# Patient Record
Sex: Female | Born: 1985 | Race: Black or African American | Hispanic: No | Marital: Married | Smoking: Former smoker
Health system: Southern US, Community
[De-identification: ages and names within clinical notes are randomized; demographics above are authoritative.]

## PROBLEM LIST (undated history)

## (undated) DIAGNOSIS — N898 Other specified noninflammatory disorders of vagina: Secondary | ICD-10-CM

## (undated) DIAGNOSIS — D649 Anemia, unspecified: Secondary | ICD-10-CM

## (undated) DIAGNOSIS — O24419 Gestational diabetes mellitus in pregnancy, unspecified control: Secondary | ICD-10-CM

## (undated) DIAGNOSIS — O26893 Other specified pregnancy related conditions, third trimester: Secondary | ICD-10-CM

## (undated) HISTORY — DX: Gestational diabetes mellitus in pregnancy, unspecified control: O24.419

## (undated) HISTORY — PX: OTHER SURGICAL HISTORY: SHX169

---

## 2014-09-28 LAB — OB RESULTS CONSOLE HEPATITIS B SURFACE ANTIGEN: Hepatitis B Surface Ag: NEGATIVE

## 2014-09-28 LAB — OB RESULTS CONSOLE ABO/RH: RH Type: POSITIVE

## 2014-09-28 LAB — OB RESULTS CONSOLE RUBELLA ANTIBODY, IGM: Rubella: NON-IMMUNE/NOT IMMUNE

## 2014-09-28 LAB — OB RESULTS CONSOLE HIV ANTIBODY (ROUTINE TESTING): HIV: NONREACTIVE

## 2014-09-28 LAB — OB RESULTS CONSOLE ANTIBODY SCREEN: ANTIBODY SCREEN: NEGATIVE

## 2014-10-13 LAB — OB RESULTS CONSOLE GC/CHLAMYDIA
Chlamydia: NEGATIVE
Gonorrhea: NEGATIVE

## 2015-01-17 ENCOUNTER — Inpatient Hospital Stay (HOSPITAL_COMMUNITY): Admit: 2015-01-17 | Payer: Self-pay | Admitting: Obstetrics and Gynecology

## 2015-02-23 LAB — OB RESULTS CONSOLE HGB/HCT, BLOOD
HEMATOCRIT: 35 %
HEMOGLOBIN: 12.3 g/dL

## 2015-02-23 LAB — OB RESULTS CONSOLE PLATELET COUNT: PLATELETS: 224 10*3/uL

## 2015-03-01 ENCOUNTER — Encounter: Payer: 59 | Attending: Obstetrics and Gynecology

## 2015-03-01 VITALS — Ht 62.0 in | Wt 250.5 lb

## 2015-03-01 DIAGNOSIS — Z713 Dietary counseling and surveillance: Secondary | ICD-10-CM | POA: Insufficient documentation

## 2015-03-01 DIAGNOSIS — O24419 Gestational diabetes mellitus in pregnancy, unspecified control: Secondary | ICD-10-CM | POA: Diagnosis present

## 2015-03-01 NOTE — Progress Notes (Signed)
  Patient was seen on 03/01/15 for Gestational Diabetes self-management . The following learning objectives were met by the patient :   States the definition of Gestational Diabetes  States why dietary management is important in controlling blood glucose  Describes the effects of carbohydrates on blood glucose levels  Demonstrates ability to create a balanced meal plan  Demonstrates carbohydrate counting   States when to check blood glucose levels  Demonstrates proper blood glucose monitoring techniques  States the effect of stress and exercise on blood glucose levels  States the importance of limiting caffeine and abstaining from alcohol and smoking  Plan:  Aim for 2 Carb Choices per meal (30 grams) +/- 1 either way for breakfast Aim for 3 Carb Choices per meal (45 grams) +/- 1 either way from lunch and dinner Aim for 1-2 Carbs per snack Begin reading food labels for Total Carbohydrate and sugar grams of foods Consider  increasing your activity level by walking daily as tolerated Begin checking BG before breakfast and 2 hours after first bit of breakfast, lunch and dinner after  as directed by MD  Take medication  as directed by MD  Blood glucose monitor given: One Touch Verio Flex Lot # [id number] X Exp: 05/2016 Blood glucose reading: $RemoveBeforeDE'197mg'CzZjQShwcULjgQk$ /dl 2hpp Biscuit, sweet tea, Reece Cup candy  Patient instructed to monitor glucose levels: FBS: 60 - <90 2 hour: <120  Patient received the following handouts:  Nutrition Diabetes and Pregnancy  Carbohydrate Counting List  Meal Planning worksheet  Patient will be seen for follow-up as needed.

## 2015-03-21 ENCOUNTER — Other Ambulatory Visit (HOSPITAL_COMMUNITY): Payer: Self-pay | Admitting: Obstetrics and Gynecology

## 2015-03-21 DIAGNOSIS — O283 Abnormal ultrasonic finding on antenatal screening of mother: Secondary | ICD-10-CM

## 2015-03-21 DIAGNOSIS — Z3A33 33 weeks gestation of pregnancy: Secondary | ICD-10-CM

## 2015-03-21 DIAGNOSIS — Z3689 Encounter for other specified antenatal screening: Secondary | ICD-10-CM

## 2015-03-21 DIAGNOSIS — O24913 Unspecified diabetes mellitus in pregnancy, third trimester: Secondary | ICD-10-CM

## 2015-03-23 ENCOUNTER — Encounter: Payer: 59 | Attending: Obstetrics and Gynecology | Admitting: *Deleted

## 2015-03-23 ENCOUNTER — Encounter (HOSPITAL_COMMUNITY): Payer: Self-pay

## 2015-03-23 ENCOUNTER — Ambulatory Visit (HOSPITAL_COMMUNITY)
Admission: RE | Admit: 2015-03-23 | Discharge: 2015-03-23 | Disposition: A | Discharge status: Home / Self Care | Payer: 59 | Source: Ambulatory Visit | Attending: Obstetrics and Gynecology | Admitting: Obstetrics and Gynecology

## 2015-03-23 ENCOUNTER — Other Ambulatory Visit (HOSPITAL_COMMUNITY): Payer: Self-pay | Admitting: Obstetrics and Gynecology

## 2015-03-23 DIAGNOSIS — O4403 Placenta previa specified as without hemorrhage, third trimester: Secondary | ICD-10-CM

## 2015-03-23 DIAGNOSIS — O283 Abnormal ultrasonic finding on antenatal screening of mother: Secondary | ICD-10-CM | POA: Diagnosis not present

## 2015-03-23 DIAGNOSIS — O34219 Maternal care for unspecified type scar from previous cesarean delivery: Secondary | ICD-10-CM

## 2015-03-23 DIAGNOSIS — Z3A33 33 weeks gestation of pregnancy: Secondary | ICD-10-CM | POA: Diagnosis not present

## 2015-03-23 DIAGNOSIS — Z713 Dietary counseling and surveillance: Secondary | ICD-10-CM | POA: Diagnosis not present

## 2015-03-23 DIAGNOSIS — O24419 Gestational diabetes mellitus in pregnancy, unspecified control: Secondary | ICD-10-CM | POA: Diagnosis present

## 2015-03-23 DIAGNOSIS — O24414 Gestational diabetes mellitus in pregnancy, insulin controlled: Secondary | ICD-10-CM | POA: Insufficient documentation

## 2015-03-23 DIAGNOSIS — Z3689 Encounter for other specified antenatal screening: Secondary | ICD-10-CM

## 2015-03-23 DIAGNOSIS — O359XX0 Maternal care for (suspected) fetal abnormality and damage, unspecified, not applicable or unspecified: Secondary | ICD-10-CM

## 2015-03-23 DIAGNOSIS — Z36 Encounter for antenatal screening of mother: Secondary | ICD-10-CM | POA: Insufficient documentation

## 2015-03-23 DIAGNOSIS — O24913 Unspecified diabetes mellitus in pregnancy, third trimester: Secondary | ICD-10-CM

## 2015-03-23 HISTORY — DX: Gestational diabetes mellitus in pregnancy, unspecified control: O24.419

## 2015-03-23 NOTE — Progress Notes (Signed)
Patient was seen on 03/23/15 for Gestational Diabetes self-management . Patient was previously seen by myself at [facility] in group class for education Gestational Diabetes. Due to elevated FBS Dr. Cherly Hensenousins placed patient on Glyburide 5mg  with dinner. The patient changed the timing to around lunch because of her 2hpp readings being elevated.  Glucose readings for the past 7 days; FBS range 95- I114, 2hpp; range 93-190mg /dl.   Encouraged Tamara Boyle to return to taking the glyburide with dinner. I phoned Dr. Purnell Shoemakerousin's office and recommended additional glyburide with breakfast, review glucose readings on Monday 03/27/15. At that time if readings are not WNL consider insulin. In review of her dietary intake Tamara RungMonique has much opportunity for improvement. I was very stern about her need to comply with GDM nutrition guidelines.  We reviewed the following objectives :   States why dietary management is important in controlling blood glucose  Describes the effects of carbohydrates on blood glucose levels  Demonstrates ability to create a balanced meal plan  Demonstrates carbohydrate counting   States when to check blood glucose levels  Demonstrates proper blood glucose monitoring techniques  States the effect of stress and exercise on blood glucose levels  States the importance of limiting caffeine and abstaining from alcohol and smoking  Plan:  Aim for 2 Carb Choices per meal (30 grams) +/- 1 either way for breakfast Aim for 3 Carb Choices per meal (45 grams) +/- 1 either way from lunch and dinner Aim for 1-2 Carbs per snack Begin reading food labels for Total Carbohydrate and sugar grams of foods Consider  increasing your activity level by walking daily as tolerated Begin checking BG before breakfast and 2 hours after first bit of breakfast, lunch and dinner after  as directed by MD  Take medication  as directed by MD  Patient instructed to monitor glucose  levels: FBS: 60 - <90 2 hour: <120  Patient received the following handouts:  Nutrition Diabetes and Pregnancy  Carbohydrate Counting List  Meal Planning worksheet  Patient will be seen for follow-up as needed.

## 2015-03-24 ENCOUNTER — Encounter (HOSPITAL_COMMUNITY): Payer: Self-pay | Admitting: Obstetrics and Gynecology

## 2015-03-27 ENCOUNTER — Encounter (HOSPITAL_COMMUNITY): Payer: Self-pay | Admitting: Obstetrics and Gynecology

## 2015-03-27 ENCOUNTER — Inpatient Hospital Stay (HOSPITAL_COMMUNITY)
Admission: EM | Admit: 2015-03-27 | Discharge: 2015-03-28 | Disposition: A | Discharge status: Home / Self Care | Payer: 59 | Source: Ambulatory Visit | Attending: Obstetrics and Gynecology | Admitting: Obstetrics and Gynecology

## 2015-03-27 DIAGNOSIS — N898 Other specified noninflammatory disorders of vagina: Secondary | ICD-10-CM | POA: Diagnosis present

## 2015-03-27 DIAGNOSIS — O4593 Premature separation of placenta, unspecified, third trimester: Secondary | ICD-10-CM | POA: Diagnosis not present

## 2015-03-27 DIAGNOSIS — O4693 Antepartum hemorrhage, unspecified, third trimester: Secondary | ICD-10-CM

## 2015-03-27 DIAGNOSIS — O26893 Other specified pregnancy related conditions, third trimester: Secondary | ICD-10-CM

## 2015-03-27 HISTORY — DX: Other specified pregnancy related conditions, third trimester: O26.893

## 2015-03-27 HISTORY — DX: Anemia, unspecified: D64.9

## 2015-03-27 HISTORY — DX: Other specified noninflammatory disorders of vagina: N89.8

## 2015-03-27 LAB — CBC
HCT: 36.7 % (ref 36.0–46.0)
HEMOGLOBIN: 12.4 g/dL (ref 12.0–15.0)
MCH: 27.1 pg (ref 26.0–34.0)
MCHC: 33.8 g/dL (ref 30.0–36.0)
MCV: 80.3 fL (ref 78.0–100.0)
Platelets: 205 10*3/uL (ref 150–400)
RBC: 4.57 MIL/uL (ref 3.87–5.11)
RDW: 14.8 % (ref 11.5–15.5)
WBC: 5.4 10*3/uL (ref 4.0–10.5)

## 2015-03-28 ENCOUNTER — Encounter (HOSPITAL_COMMUNITY): Payer: Self-pay | Admitting: *Deleted

## 2015-03-28 ENCOUNTER — Inpatient Hospital Stay (HOSPITAL_COMMUNITY): Payer: 59 | Admitting: Anesthesiology

## 2015-03-28 ENCOUNTER — Encounter (HOSPITAL_COMMUNITY)
Admission: AD | Disposition: A | Discharge status: Home / Self Care | Payer: Self-pay | Source: Ambulatory Visit | Attending: Obstetrics and Gynecology

## 2015-03-28 ENCOUNTER — Inpatient Hospital Stay (HOSPITAL_COMMUNITY): Payer: 59

## 2015-03-28 ENCOUNTER — Inpatient Hospital Stay (HOSPITAL_COMMUNITY)
Admission: AD | Admit: 2015-03-28 | Discharge: 2015-03-31 | DRG: 765 | Disposition: A | Discharge status: Home / Self Care | Payer: 59 | Source: Ambulatory Visit | Attending: Obstetrics and Gynecology | Admitting: Obstetrics and Gynecology

## 2015-03-28 DIAGNOSIS — O4593 Premature separation of placenta, unspecified, third trimester: Secondary | ICD-10-CM

## 2015-03-28 DIAGNOSIS — R58 Hemorrhage, not elsewhere classified: Secondary | ICD-10-CM

## 2015-03-28 DIAGNOSIS — O359XX0 Maternal care for (suspected) fetal abnormality and damage, unspecified, not applicable or unspecified: Secondary | ICD-10-CM

## 2015-03-28 DIAGNOSIS — Z823 Family history of stroke: Secondary | ICD-10-CM | POA: Diagnosis not present

## 2015-03-28 DIAGNOSIS — O24419 Gestational diabetes mellitus in pregnancy, unspecified control: Secondary | ICD-10-CM

## 2015-03-28 DIAGNOSIS — D62 Acute posthemorrhagic anemia: Secondary | ICD-10-CM | POA: Diagnosis not present

## 2015-03-28 DIAGNOSIS — Z3A34 34 weeks gestation of pregnancy: Secondary | ICD-10-CM

## 2015-03-28 DIAGNOSIS — Z87891 Personal history of nicotine dependence: Secondary | ICD-10-CM

## 2015-03-28 DIAGNOSIS — O24425 Gestational diabetes mellitus in childbirth, controlled by oral hypoglycemic drugs: Secondary | ICD-10-CM | POA: Diagnosis present

## 2015-03-28 DIAGNOSIS — O9081 Anemia of the puerperium: Secondary | ICD-10-CM | POA: Diagnosis not present

## 2015-03-28 DIAGNOSIS — O365930 Maternal care for other known or suspected poor fetal growth, third trimester, not applicable or unspecified: Secondary | ICD-10-CM | POA: Diagnosis present

## 2015-03-28 DIAGNOSIS — O99214 Obesity complicating childbirth: Secondary | ICD-10-CM | POA: Diagnosis present

## 2015-03-28 DIAGNOSIS — O34219 Maternal care for unspecified type scar from previous cesarean delivery: Secondary | ICD-10-CM | POA: Diagnosis present

## 2015-03-28 DIAGNOSIS — O4103X0 Oligohydramnios, third trimester, not applicable or unspecified: Secondary | ICD-10-CM | POA: Diagnosis present

## 2015-03-28 DIAGNOSIS — O24414 Gestational diabetes mellitus in pregnancy, insulin controlled: Secondary | ICD-10-CM

## 2015-03-28 DIAGNOSIS — N83209 Unspecified ovarian cyst, unspecified side: Secondary | ICD-10-CM | POA: Diagnosis present

## 2015-03-28 DIAGNOSIS — Z833 Family history of diabetes mellitus: Secondary | ICD-10-CM

## 2015-03-28 DIAGNOSIS — O900 Disruption of cesarean delivery wound: Secondary | ICD-10-CM | POA: Diagnosis present

## 2015-03-28 DIAGNOSIS — O4693 Antepartum hemorrhage, unspecified, third trimester: Secondary | ICD-10-CM

## 2015-03-28 DIAGNOSIS — O3483 Maternal care for other abnormalities of pelvic organs, third trimester: Secondary | ICD-10-CM | POA: Diagnosis present

## 2015-03-28 DIAGNOSIS — Z6841 Body Mass Index (BMI) 40.0 and over, adult: Secondary | ICD-10-CM

## 2015-03-28 LAB — GLUCOSE, CAPILLARY: Glucose-Capillary: 79 mg/dL (ref 65–99)

## 2015-03-28 LAB — CBC
HCT: 37.3 % (ref 36.0–46.0)
Hemoglobin: 12.4 g/dL (ref 12.0–15.0)
MCH: 26.7 pg (ref 26.0–34.0)
MCHC: 33.2 g/dL (ref 30.0–36.0)
MCV: 80.4 fL (ref 78.0–100.0)
PLATELETS: 195 10*3/uL (ref 150–400)
RBC: 4.64 MIL/uL (ref 3.87–5.11)
RDW: 14.7 % (ref 11.5–15.5)
WBC: 7.6 10*3/uL (ref 4.0–10.5)

## 2015-03-28 LAB — TYPE AND SCREEN
ABO/RH(D): O POS
ANTIBODY SCREEN: NEGATIVE

## 2015-03-28 LAB — SAVE SMEAR

## 2015-03-28 LAB — GROUP B STREP BY PCR: Group B strep by PCR: NEGATIVE

## 2015-03-28 LAB — FIBRINOGEN: Fibrinogen: 474 mg/dL (ref 204–475)

## 2015-03-28 LAB — PROTIME-INR
INR: 1.05 (ref 0.00–1.49)
Prothrombin Time: 13.9 seconds (ref 11.6–15.2)

## 2015-03-28 LAB — APTT: aPTT: 28 seconds (ref 24–37)

## 2015-03-28 SURGERY — Surgical Case
Anesthesia: Spinal | Site: Abdomen

## 2015-03-28 MED ORDER — PHENYLEPHRINE 8 MG IN D5W 100 ML (0.08MG/ML) PREMIX OPTIME
INJECTION | INTRAVENOUS | Status: DC | PRN
  Administered 2015-03-28: 60 ug/min via INTRAVENOUS

## 2015-03-28 MED ORDER — CEFAZOLIN SODIUM-DEXTROSE 2-3 GM-% IV SOLR
INTRAVENOUS | Status: DC | PRN
  Administered 2015-03-28: 2 g via INTRAVENOUS

## 2015-03-28 MED ORDER — LACTATED RINGERS IV SOLN
INTRAVENOUS | Status: DC
  Administered 2015-03-28 (×2): via INTRAVENOUS

## 2015-03-28 MED ORDER — FENTANYL CITRATE (PF) 100 MCG/2ML IJ SOLN
INTRAMUSCULAR | Status: AC
  Filled 2015-03-28: qty 4

## 2015-03-28 MED ORDER — FENTANYL CITRATE (PF) 100 MCG/2ML IJ SOLN
INTRAMUSCULAR | Status: DC | PRN
  Administered 2015-03-28: 10 ug via INTRATHECAL
  Administered 2015-03-29: 90 ug via INTRAVENOUS

## 2015-03-28 MED ORDER — MORPHINE SULFATE (PF) 0.5 MG/ML IJ SOLN
INTRAMUSCULAR | Status: DC | PRN
  Administered 2015-03-28: .2 mg via INTRATHECAL
  Administered 2015-03-29: 4.8 mg via INTRAVENOUS

## 2015-03-28 MED ORDER — BUPIVACAINE IN DEXTROSE 0.75-8.25 % IT SOLN
INTRATHECAL | Status: DC | PRN
  Administered 2015-03-28: 1.6 mg via INTRATHECAL

## 2015-03-28 MED ORDER — MAGNESIUM SULFATE 50 % IJ SOLN
2.0000 g/h | INTRAMUSCULAR | Status: DC
  Filled 2015-03-28: qty 80

## 2015-03-28 MED ORDER — MEPERIDINE HCL 25 MG/ML IJ SOLN
INTRAMUSCULAR | Status: AC
  Filled 2015-03-28: qty 1

## 2015-03-28 MED ORDER — MORPHINE SULFATE (PF) 0.5 MG/ML IJ SOLN
INTRAMUSCULAR | Status: AC
  Filled 2015-03-28: qty 100

## 2015-03-28 MED ORDER — GLYBURIDE 5 MG PO TABS
5.0000 mg | ORAL_TABLET | Freq: Two times a day (BID) | ORAL | Status: DC
  Filled 2015-03-28 (×2): qty 1

## 2015-03-28 MED ORDER — MAGNESIUM SULFATE BOLUS VIA INFUSION
4.0000 g | Freq: Once | INTRAVENOUS | Status: AC
  Administered 2015-03-28: 4 g via INTRAVENOUS
  Filled 2015-03-28: qty 500

## 2015-03-28 MED ORDER — CITRIC ACID-SODIUM CITRATE 334-500 MG/5ML PO SOLN
ORAL | Status: AC
  Administered 2015-03-28: 30 mL
  Filled 2015-03-28: qty 15

## 2015-03-28 MED ORDER — BETAMETHASONE SOD PHOS & ACET 6 (3-3) MG/ML IJ SUSP
12.0000 mg | INTRAMUSCULAR | Status: DC
  Administered 2015-03-28: 12 mg via INTRAMUSCULAR
  Filled 2015-03-28: qty 2

## 2015-03-28 MED ORDER — BUPIVACAINE HCL (PF) 0.25 % IJ SOLN
INTRAMUSCULAR | Status: AC
  Filled 2015-03-28: qty 30

## 2015-03-28 SURGICAL SUPPLY — 41 items
BARRIER ADHS 3X4 INTERCEED (GAUZE/BANDAGES/DRESSINGS) ×3 IMPLANT
BENZOIN TINCTURE PRP APPL 2/3 (GAUZE/BANDAGES/DRESSINGS) IMPLANT
CLAMP CORD UMBIL (MISCELLANEOUS) IMPLANT
CLOSURE WOUND 1/2 X4 (GAUZE/BANDAGES/DRESSINGS)
CLOTH BEACON ORANGE TIMEOUT ST (SAFETY) ×3 IMPLANT
CONTAINER PREFILL 10% NBF 15ML (MISCELLANEOUS) IMPLANT
DRAPE C SECTION CLR SCREEN (DRAPES) ×3 IMPLANT
DRAPE SHEET LG 3/4 BI-LAMINATE (DRAPES) IMPLANT
DRSG OPSITE POSTOP 4X10 (GAUZE/BANDAGES/DRESSINGS) ×3 IMPLANT
DURAPREP 26ML APPLICATOR (WOUND CARE) ×3 IMPLANT
ELECT REM PT RETURN 9FT ADLT (ELECTROSURGICAL) ×3
ELECTRODE REM PT RTRN 9FT ADLT (ELECTROSURGICAL) ×1 IMPLANT
EXTRACTOR VACUUM M CUP 4 TUBE (SUCTIONS) ×2 IMPLANT
EXTRACTOR VACUUM M CUP 4' TUBE (SUCTIONS) ×1
GLOVE BIOGEL PI IND STRL 7.0 (GLOVE) ×1 IMPLANT
GLOVE BIOGEL PI INDICATOR 7.0 (GLOVE) ×2
GLOVE ECLIPSE 6.5 STRL STRAW (GLOVE) ×3 IMPLANT
GOWN STRL REUS W/TWL LRG LVL3 (GOWN DISPOSABLE) ×6 IMPLANT
KIT ABG SYR 3ML LUER SLIP (SYRINGE) ×3 IMPLANT
NEEDLE HYPO 22GX1.5 SAFETY (NEEDLE) ×3 IMPLANT
NEEDLE HYPO 25X5/8 SAFETYGLIDE (NEEDLE) ×3 IMPLANT
NS IRRIG 1000ML POUR BTL (IV SOLUTION) ×6 IMPLANT
PACK C SECTION WH (CUSTOM PROCEDURE TRAY) ×3 IMPLANT
PAD OB MATERNITY 4.3X12.25 (PERSONAL CARE ITEMS) ×3 IMPLANT
RTRCTR C-SECT PINK 25CM LRG (MISCELLANEOUS) IMPLANT
SPONGE LAP 18X18 X RAY DECT (DISPOSABLE) ×6 IMPLANT
STRIP CLOSURE SKIN 1/2X4 (GAUZE/BANDAGES/DRESSINGS) IMPLANT
SUT CHROMIC GUT AB #0 18 (SUTURE) IMPLANT
SUT MNCRL 0 VIOLET CTX 36 (SUTURE) ×3 IMPLANT
SUT MON AB 4-0 PS1 27 (SUTURE) ×3 IMPLANT
SUT MONOCRYL 0 CTX 36 (SUTURE) ×6
SUT PLAIN 2 0 (SUTURE)
SUT PLAIN 2 0 XLH (SUTURE) ×3 IMPLANT
SUT PLAIN ABS 2-0 CT1 27XMFL (SUTURE) IMPLANT
SUT VIC AB 0 CT1 36 (SUTURE) ×6 IMPLANT
SUT VIC AB 2-0 CT1 27 (SUTURE) ×2
SUT VIC AB 2-0 CT1 TAPERPNT 27 (SUTURE) ×1 IMPLANT
SUT VIC AB 4-0 PS2 27 (SUTURE) IMPLANT
SYR CONTROL 10ML LL (SYRINGE) ×3 IMPLANT
TOWEL OR 17X24 6PK STRL BLUE (TOWEL DISPOSABLE) ×3 IMPLANT
TRAY FOLEY CATH SILVER 14FR (SET/KITS/TRAYS/PACK) IMPLANT

## 2015-03-28 NOTE — MAU Note (Addendum)
Was here last night for bleeding.  Saw midwife.  Started bleeding again today.  occ sharp pains, a lot of pressure.  Baby is very active. Sent from office

## 2015-03-28 NOTE — MAU Provider Note (Signed)
History     CSN: [id number]  Arrival date and time: 03/27/15 2258  Provider on unit: 2050 Provider at bedside: 2350   Chief Complaint  Patient presents with  . Vaginal Bleeding   HPI  Tamara Boyle is a 29 yo G2P1001 female at 34.[redacted] wks gestation by LMP, presenting with complaints of gushing blood, mild abdominal cramping and low back pain.  She reports that the blood trickled down her legs at home. (+) FM. She is class A2 GDM on Glyburide 5 mg q pm.  Placenta previa hx in this current pregnancy, but informed it was resolved at her MFM sono last week. She resumed SI twice since notification on resolved previa; no postcoital bleeding noted.  She did have some mucoid vaginal d/c last night and was told to monitor at home by on-call CNM. In addition to GDM, her prenatal care has been complicated by intrauterine growth restriction. Her primary OB provider is Dr. Cherly Hensenousins.  Past Medical History  Diagnosis Date  . Gestational diabetes mellitus, antepartum   . Gestational diabetes   . Vaginal discharge during pregnancy in third trimester 03/27/2015  . Anemia     Past Surgical History  Procedure Laterality Date  . None    . Cesarean section      Family History  Problem Relation Age of Onset  . Diabetes Other   . Stroke Other   . Obesity Other   . Heart disease Other     Social History  Substance Use Topics  . Smoking status: Former Smoker    Quit date: 08/25/2014  . Smokeless tobacco: None  . Alcohol Use: No    Allergies: No Known Allergies  Prescriptions prior to admission  Medication Sig Dispense Refill Last Dose  . Iron TABS Take 1 each by mouth daily.   Past Week at Unknown time  . Prenatal Multivit-Min-Fe-FA (PRENATAL VITAMINS PO) Take 1 each by mouth daily.   03/27/2015 at Unknown time    Review of Systems  Constitutional: Negative.   HENT: Negative.   Eyes: Negative.   Respiratory: Negative.   Cardiovascular: Negative.   Gastrointestinal: Positive for  abdominal pain and constipation.       Mild cramping; mild constipation, but able to have BM since being in MAU  Genitourinary:       Some lower pelvic pressure and cramping  Musculoskeletal: Negative.   Skin: Negative.   Neurological: Negative.   Endo/Heme/Allergies: Negative.   Psychiatric/Behavioral: Negative.     Results for orders placed or performed during the hospital encounter of 03/27/15 (from the past 24 hour(s))  CBC     Status: None   Collection Time: 03/27/15 11:25 PM  Result Value Ref Range   WBC 5.4 4.0 - 10.5 K/uL   RBC 4.57 3.87 - 5.11 MIL/uL   Hemoglobin 12.4 12.0 - 15.0 g/dL   HCT 40.936.7 81.136.0 - 91.446.0 %   MCV 80.3 78.0 - 100.0 fL   MCH 27.1 26.0 - 34.0 pg   MCHC 33.8 30.0 - 36.0 g/dL   RDW 78.214.8 95.611.5 - 21.315.5 %   Platelets 205 150 - 400 K/uL   CEFM  FHR: 140 bpm / moderate variability / accels present / decels absent TOCO: none   Physical Exam   Blood pressure 133/87, pulse 92, temperature 98.6 F (37 C), temperature source Oral, resp. rate 18, height 5\' 2"  (1.575 m), weight 112.946 kg (249 lb), last menstrual period 07/31/2014, SpO2 99 %.  Physical Exam  Constitutional: She  is oriented to person, place, and time. She appears well-developed and well-nourished.  HENT:  Head: Normocephalic.  Eyes: Pupils are equal, round, and reactive to light.  Neck: Normal range of motion.  Cardiovascular: Normal rate, regular rhythm and normal heart sounds.   Respiratory: Effort normal and breath sounds normal.  GI: Soft. Bowel sounds are normal.  Genitourinary:  Small amount of dark red blood in vaginal vault cleared with 3 large cotton swabs; scant amount of mucous coming from cx os; SVE: closed/thick/high/soft  Musculoskeletal: Normal range of motion.  Neurological: She is alert and oriented to person, place, and time. She has normal reflexes.  Psychiatric: She has a normal mood and affect. Her behavior is normal. Judgment and thought content normal.    MAU Course   Procedures NST OB Limited U/S - check placenta CBC - WNL Assessment and Plan  1) G2P1001, 34.2 wks 2) Vaginal Bleeding affecting pregnancy, third trimester 3) Class A2 GDM - on Glyburide  1) Discharge Home 2) PTL Precautions 3) Keep scheduled appt 10/28 with Dr. Cherly Hensen 4) Call the office, if bleeding worsens or not improved 5) Pelvic Rest   Kenard Gower MSN, CNM 03/28/2015, 12:05 AM

## 2015-03-28 NOTE — H&P (Addendum)
OB ADMISSION/ HISTORY & PHYSICAL:  Admission Date: 03/28/2015  2:22 PM  Admit Diagnosis: 34.[redacted] weeks gestation, Placental abruption  Tamara Boyle is a 29 y.o. female presenting for VB. Seen in MAU last night for VB with negative exam and sono. Evaluation today reveals findings consistent with abruption.  Prenatal History: G2P1001   EDC:05/07/2015, by Last Menstrual Period   [facility]  Primary Ob Provider: Dr. Cherly Hensenousins Prenatal course complicated by A2GDM on Glyburide-poor compliance and control, small BPD and low AF-evaluated by MFM, ovarian cyst, previous CS, hx PCOS, obesity, and persistent placenta previa although was found to be resolved on sono 5 days ago.   Prenatal Labs: ABO, Rh:   O Pos Antibody:  neg Rubella:   NI RPR:   neg HBsAg:  neg  HIV:   neg GBS:   unknown 1 hr GTT: 186 Genetic screen: neg  Medical / Surgical History :  Past medical history:  Past Medical History  Diagnosis Date  . Gestational diabetes mellitus, antepartum   . Gestational diabetes   . Vaginal discharge during pregnancy in third trimester 03/27/2015  . Anemia      Past surgical history:  Past Surgical History  Procedure Laterality Date  . None    . Cesarean section      Family History:  Family History  Problem Relation Age of Onset  . Diabetes Other   . Stroke Other   . Obesity Other   . Heart disease Other      Social History:  reports that she quit smoking about 7 months ago. She does not have any smokeless tobacco history on file. She reports that she does not drink alcohol or use illicit drugs.  Allergies: Review of patient's allergies indicates no known allergies.   Current Medications at time of admission:  Prior to Admission medications   Medication Sig Start Date End Date Taking? Authorizing Provider  acetaminophen (TYLENOL) 500 MG tablet Take 1,000 mg by mouth every 6 (six) hours as needed for moderate pain.   Yes Historical  Provider, MD  Bisacodyl (DULCOLAX PO) Take 1 tablet by mouth daily as needed (constipation).   Yes Historical Provider, MD  CALCIUM PO Take 1 tablet by mouth daily.   Yes Historical Provider, MD  glyBURIDE (DIABETA) 5 MG tablet Take 5 mg by mouth 2 (two) times daily with a meal.   Yes Historical Provider, MD  Iron TABS Take 1 each by mouth daily.   Yes Historical Provider, MD  Prenatal Multivit-Min-Fe-FA (PRENATAL VITAMINS PO) Take 1 each by mouth daily.   Yes Historical Provider, MD     Review of Systems: +VB +FM No LOF No back pain  Physical Exam:  VS: Blood pressure 119/80, pulse 79, temperature 98.1 F (36.7 C), temperature source Oral, resp. rate 18, weight 112.311 kg (247 lb 9.6 oz), last menstrual period 07/31/2014.  General: alert and oriented, appears comfortable Heart: RRR Lungs: Clear lung fields Abdomen: Gravid, soft and non-tender, non-distended / uterus: gravid, non-tender Extremities: No edema Genitalia / VE: Dilation: 1.5 Effacement (%): 70 Station: -2 Exam by:: Melanie Bhambri CNM FHR: baseline rate 130 / variability mod / accelerations + / no decelerations TOCO: 2-4, mild  Assessment: 34.[redacted] weeks gestation Placental abruption A2GDM Cat I FHT  Plan:  Admit to antepartum, MgSO4 for PTL until betamethasone complete BR , continuous EFM,  MFM consult, NICU consult, .diabetes consult to assist with anticipated elevation of BS Consult with Dr. Cherly Hensenousins / agrees with  plan of care / MD to manage   Donette Larry, N MSN, CNM 03/28/2015, 5:17 PM   ON call MD/Primary provider:    Unexplained placental abruption with presentation of 3rd trim vaginal bleeding. Previous previa now resolved Class A2 GDM not controlled P) add antiphosholipid antibody panel. Noncompliance with BS. Will remain inpt.  CBG 4x/d Pt known to MFM.

## 2015-03-28 NOTE — Plan of Care (Signed)
Problem: Consults Goal: Birthing Suites Patient Information Press F2 to bring up selections list   Pt < [redacted] weeks EGA     

## 2015-03-28 NOTE — Anesthesia Procedure Notes (Signed)
Spinal Patient location during procedure: OR Staffing Anesthesiologist: Dejean Tribby Performed by: anesthesiologist  Preanesthetic Checklist Completed: patient identified, site marked, surgical consent, pre-op evaluation, timeout performed, IV checked, risks and benefits discussed and monitors and equipment checked Spinal Block Patient position: sitting Prep: DuraPrep Patient monitoring: continuous pulse ox, blood pressure and heart rate Approach: midline Location: L3-4 Injection technique: single-shot Needle Needle type: Sprotte  Needle gauge: 24 G Needle length: 9 cm Additional Notes Functioning IV was confirmed and monitors were applied. Sterile prep and drape, including hand hygiene, mask and sterile gloves were used. The patient was positioned and the spine was prepped. The skin was anesthetized with lidocaine.  Free flow of clear CSF was obtained prior to injecting local anesthetic into the CSF.  The spinal needle aspirated freely following injection.  The needle was carefully withdrawn.  The patient tolerated the procedure well. Consent was obtained prior to procedure with all questions answered and concerns addressed. Risks including but not limited to bleeding, infection, nerve damage, paralysis, failed block, inadequate analgesia, allergic reaction, high spinal, itching and headache were discussed and the patient wished to proceed.   Joni Norrod, MD     

## 2015-03-28 NOTE — Progress Notes (Signed)
Pitcher of water to pt

## 2015-03-28 NOTE — Progress Notes (Signed)
Fabian NovemberM Bhambri CNM notified of pt's admission and status. Aware of uterine active, reactive FHR, scant vag bleeding on admission. Occ pain LLQ but comfortable and unaware of ctxs. WIll see pt

## 2015-03-28 NOTE — Consult Note (Signed)
[facility]  Consultation Service: Neonatology   Dr. Garwin Brothers has asked for consultation on Tamara. Boyle MRN #[id number] regarding the care of a premature infant at 39 2/[redacted] week EGA. Thank you for inviting [place] to see this patient.   Reason for consult:  Explain the possible complications, the prognosis, and the care of a premature infant at 58 2/[redacted] week EGA.  Chief complaint: Tamara. Edmonds is a 29yo female with a 34 2/7 week IUP with an estimated weight of ~2100 grams.   My key findings of this patient's HPI are:  I have reviewed the patient's chart and have met with her. The salient information is as follows:  29yo female with a 34 2/[redacted] week EGA little girl who has represented with complaints of vaginal bleeding.  H/o previa reportedly resolved last week.  +contractions.  No LOF/ROM.  No infection concerns.    Prenatal Labs: ABO, Rh:   O Pos Antibody:  neg Rubella:   NI RPR:   neg HBsAg:  neg  HIV:   neg GBS:   unknown 1 hr GTT: 186 Genetic screen: neg  Prenatal care: good Pregnancy complications: A1OIN on Glyburide-poor compliance and control, small BPD and low AF-evaluated by MFM, ovarian cyst, previous CS, hx PCOS, obesity, and persistent placenta previa although was found to be resolved on sono 5 days ago. Maternal antibiotics: This patient's mother is not on file. Maternal Steroids: partial   Most recent dose:  03/28/15 at [place]    My recommendations for this patient and my actions included:   1. In the presence of the patient and her husband, I spent 25 minutes discussing the possible complications and outcomes of prematurity at this gestational age. I gave the patient a [employer] of [employer] handout, written in lay language, that discussed the common complications and survival data of the premature infant and a summary handout with graph and table. I discussed the potential need for resuscitation at birth, mechanical ventilation and surfactant administration for  respiratory distress, IV fluids pending establishment of enteral feeds (encouraged breast milk feeding to which she plans to do), antibiotics for possible sepsis, temperature support, and monitoring. I also discussed the potential risk of complications such as intracranial hemorrhage, retinopathy, hearing deficit, and chronic lung disease. I also discussed the potential length of stay in the neonatal intensive care unit for about 6 weeks. I discussed this with parents in detail and they expressed an understanding of the risks and complications of prematurity.   2. I also discussed the expected survival of an infant born at 63 2/7 weeks, which is >97%. We further discussed that roughly <3% of neonates born at this age have severe neurological complications and that around 25% have school difficulties. She expressed an understanding of this information.   3. I informed her that the NICU team would be present at the delivery. She agreed that all appropriate medical measures could be taken to resuscitate her infant at the delivery.   4. A visit to the NICU by the infant's mother and/or a significant other was encouraged. Visitation policy was discussed particularly for their elementary age son.   Final Impression:  Tamara Daley is a 29yo female with a 40 2/7 week IUP who is threatening to deliver and who now understands the possible complications and prognosis of her infant.     ______________________________________________________________________  Thank you for asking [place] to participate in the care of this patient. Please do not hesitate to contact us again  if you are aware of any further ways we can be of assistance.   Sincerely,  Monia Sabal. Katherina Mires, MD Neonatologist  I spent ~40 minutes in consultation time, of which 25 minutes was spent in direct face to face counseling.

## 2015-03-28 NOTE — Anesthesia Preprocedure Evaluation (Signed)
Anesthesia Evaluation  Patient identified by MRN, date of birth, ID band Patient awake    Reviewed: Allergy & Precautions, NPO status , Patient's Chart, lab work & pertinent test results  History of Anesthesia Complications Negative for: history of anesthetic complications  Airway Mallampati: II  TM Distance: >3 FB Neck ROM: Full    Dental no notable dental hx. (+) Dental Advisory Given   Pulmonary former smoker,    Pulmonary exam normal breath sounds clear to auscultation       Cardiovascular negative cardio ROS Normal cardiovascular exam Rhythm:Regular Rate:Normal     Neuro/Psych negative neurological ROS  negative psych ROS   GI/Hepatic negative GI ROS, Neg liver ROS,   Endo/Other  diabetesMorbid obesity  Renal/GU negative Renal ROS  negative genitourinary   Musculoskeletal negative musculoskeletal ROS (+)   Abdominal   Peds negative pediatric ROS (+)  Hematology negative hematology ROS (+)   Anesthesia Other Findings   Reproductive/Obstetrics (+) Pregnancy                             Anesthesia Physical Anesthesia Plan  ASA: III and emergent  Anesthesia Plan: Spinal   Post-op Pain Management:    Induction:   Airway Management Planned:   Additional Equipment:   Intra-op Plan:   Post-operative Plan:   Informed Consent: I have reviewed the patients History and Physical, chart, labs and discussed the procedure including the risks, benefits and alternatives for the proposed anesthesia with the patient or authorized representative who has indicated his/her understanding and acceptance.   Dental advisory given  Plan Discussed with:   Anesthesia Plan Comments:         Anesthesia Quick Evaluation

## 2015-03-28 NOTE — Discharge Instructions (Signed)
Pelvic Rest °Pelvic rest is sometimes recommended for women when:  °· The placenta is partially or completely covering the opening of the cervix (placenta previa). °· There is bleeding between the uterine wall and the amniotic sac in the first trimester (subchorionic hemorrhage). °· The cervix begins to open without labor starting (incompetent cervix, cervical insufficiency). °· The labor is too early (preterm labor). °HOME CARE INSTRUCTIONS °· Do not have sexual intercourse, stimulation, or an orgasm. °· Do not use tampons, douche, or put anything in the vagina. °· Do not lift anything over 10 pounds (4.5 kg). °· Avoid strenuous activity or straining your pelvic muscles. °SEEK MEDICAL CARE IF:  °· You have any vaginal bleeding during pregnancy. Treat this as a potential emergency. °· You have cramping pain felt low in the stomach (stronger than menstrual cramps). °· You notice vaginal discharge (watery, mucus, or bloody). °· You have a low, dull backache. °· There are regular contractions or uterine tightening. °SEEK IMMEDIATE MEDICAL CARE IF: °You have vaginal bleeding and have placenta previa.  °  °This information is not intended to replace advice given to you by your health care provider. Make sure you discuss any questions you have with your health care provider. °  °Document Released: 09/14/2010 Document Revised: 08/12/2011 Document Reviewed: 11/21/2014 °Elsevier Interactive Patient Education ©2016 Elsevier Inc[employer] ° °Vaginal Bleeding During Pregnancy, Third Trimester °A small amount of bleeding (spotting) from the vagina is relatively common in pregnancy. Various things can cause bleeding or spotting in pregnancy. Sometimes the bleeding is normal and is not a problem. However, bleeding during the third trimester can also be a sign of something serious for the mother and the baby. Be sure to tell your health care provider about any vaginal bleeding right away.  °Some possible causes of vaginal bleeding during  the third trimester include:  °· The placenta may be partially or completely covering the opening to the cervix (placenta previa).   °· The placenta may have separated from the uterus (abruption of the placenta).   °· There may be an infection or growth on the cervix.   °· You may be starting labor, called discharging of the mucus plug.   °· The placenta may grow into the muscle layer of the uterus (placenta accreta).   °HOME CARE INSTRUCTIONS  °Watch your condition for any changes. The following actions may help to lessen any discomfort you are feeling:  °· Follow your health care provider's instructions for limiting your activity. If your health care provider orders bed rest, you may need to stay in bed and only get up to use the bathroom. However, your health care provider may allow you to continue light activity. °· If needed, make plans for someone to help with your regular activities and responsibilities while you are on bed rest. °· Keep track of the number of pads you use each day, how often you change pads, and how soaked (saturated) they are. Write this down. °· Do not use tampons. Do not douche. °· Do not have sexual intercourse or orgasms until approved by your health care provider. °· Follow your health care provider's advice about lifting, driving, and physical activities. °· If you pass any tissue from your vagina, save the tissue so you can show it to your health care provider.   °· Only take over-the-counter or prescription medicines as directed by your health care provider. °· Do not take aspirin because it can make you bleed.   °· Keep all follow-up appointments as directed by your health care provider. °  SEEK MEDICAL CARE IF: °· You have any vaginal bleeding during any part of your pregnancy. °· You have cramps or labor pains. °· You have a fever, not controlled by medicine. °SEEK IMMEDIATE MEDICAL CARE IF:  °· You have severe cramps or pain in your back or belly (abdomen). °· You have  chills. °· You have a gush of fluid from the vagina. °· You pass large clots or tissue from your vagina. °· Your bleeding increases. °· You feel light-headed or weak. °· You pass out. °· You feel less movement or no movement of the baby.   °MAKE SURE YOU: °· Understand these instructions. °· Will watch your condition. °· Will get help right away if you are not doing well or get worse. °  °This information is not intended to replace advice given to you by your health care provider. Make sure you discuss any questions you have with your health care provider. °  °Document Released: 08/10/2002 Document Revised: 05/25/2013 Document Reviewed: 01/25/2013 °Elsevier Interactive Patient Education ©2016 Elsevier Inc. ° °

## 2015-03-28 NOTE — Progress Notes (Signed)
Pt is unaware of ctxs.

## 2015-03-28 NOTE — Progress Notes (Signed)
Called for increased vaginal bleeding Pt notes painful ctx   O:  Filed Vitals:   03/28/15 1951 03/28/15 2001 03/28/15 2011 03/28/15 2102  BP: 125/86 112/84 125/69 124/68  Pulse: 86 90 89 88  Temp:      TempSrc:      Resp:  20  20  Height:      Weight:      SpO2:      abd gravid soft nontender Pelvic: SSE; (+) small amount of blood in vault 1/70%/-1 vtx  Tracing: baseline 140 (+) accels. Ctx q 3-9 mins BS 79 CBC Latest Ref Rng 03/28/2015 03/27/2015 02/23/2015  WBC 4.0 - 10.5 K/uL 7.6 5.4 -  Hemoglobin 12.0 - 15.0 g/dL 78.212.4 95.612.4 21.312.3  Hematocrit 36.0 - 46.0 % 37.3 36.7 35  Platelets 150 - 400 K/uL 195 205 224    IMP: placental abruption on magnesium sulfate S/P BMZ x 1 Previous C/S PTL  Class A2 GDM IUP @ 34 2/7 weeks  P) recheck placenta, BPP, AFI Stadol prn after sonographic assessment. Obtained surgical consent with risk of surgery reviewed including  Infection, bleeding, injury to surrounding organ structures, internal scar tissue, possible blood transfusion and its risk(HIV, acute rxn,  Hepatitis) . Signed and on chart. Disc plan to try to get the 2nd BMZ in place provided no evidence of fetal or maternal compromise

## 2015-03-28 NOTE — MAU Provider Note (Signed)
History     CSN: [id number]  Arrival date and time: 03/28/15 1422  Provider on unit: 2050 Provider at bedside: 2350   Chief Complaint  Patient presents with  . Vaginal Bleeding   Vaginal Bleeding Associated symptoms include abdominal pain and constipation.    [name] is a 29 yo G2P1001 female at 34.[redacted] wks gestation by LMP, presenting with complaints of gushing blood, mild abdominal cramping and low back pain.  She reports that the blood trickled down her legs at home. (+) FM. She is class A2 GDM on Glyburide 5 mg q pm.  Placenta previa hx in this current pregnancy, but informed it was resolved at her MFM sono last week. She resumed SI twice since notification on resolved previa; no postcoital bleeding noted.  She did have some mucoid vaginal d/c last night and was told to monitor at home by on-call CNM. In addition to GDM, her prenatal care has been complicated by intrauterine growth restriction. Her primary OB provider is Dr. Cherly Hensenousins.  Past Medical History  Diagnosis Date  . Gestational diabetes mellitus, antepartum   . Gestational diabetes   . Vaginal discharge during pregnancy in third trimester 03/27/2015  . Anemia     Past Surgical History  Procedure Laterality Date  . None    . Cesarean section      Family History  Problem Relation Age of Onset  . Diabetes Other   . Stroke Other   . Obesity Other   . Heart disease Other     Social History  Substance Use Topics  . Smoking status: Former Smoker    Quit date: 08/25/2014  . Smokeless tobacco: None  . Alcohol Use: No    Allergies: No Known Allergies  Prescriptions prior to admission  Medication Sig Dispense Refill Last Dose  . Iron TABS Take 1 each by mouth daily.   Past Week at Unknown time  . Prenatal Multivit-Min-Fe-FA (PRENATAL VITAMINS PO) Take 1 each by mouth daily.   03/27/2015 at Unknown time    Review of Systems  Constitutional: Negative.   HENT: Negative.   Eyes: Negative.    Respiratory: Negative.   Cardiovascular: Negative.   Gastrointestinal: Positive for abdominal pain and constipation.       Mild cramping; mild constipation, but able to have BM since being in MAU  Genitourinary: Positive for vaginal bleeding.       Some lower pelvic pressure and cramping  Musculoskeletal: Negative.   Skin: Negative.   Neurological: Negative.   Endo/Heme/Allergies: Negative.   Psychiatric/Behavioral: Negative.     Results for orders placed or performed during the hospital encounter of 03/27/15 (from the past 24 hour(s))  CBC     Status: None   Collection Time: 03/27/15 11:25 PM  Result Value Ref Range   WBC 5.4 4.0 - 10.5 K/uL   RBC 4.57 3.87 - 5.11 MIL/uL   Hemoglobin 12.4 12.0 - 15.0 g/dL   HCT 40.936.7 81.136.0 - 91.446.0 %   MCV 80.3 78.0 - 100.0 fL   MCH 27.1 26.0 - 34.0 pg   MCHC 33.8 30.0 - 36.0 g/dL   RDW 78.214.8 95.611.5 - 21.315.5 %   Platelets 205 150 - 400 K/uL   CEFM  FHR: 140 bpm / moderate variability / accels present / decels absent TOCO: None  OB Limited U/S No previa or abruption seen / Anterior Placenta above the os / AFI WNL / cephalic   Physical Exam   Blood pressure 119/80, pulse 79,  temperature 98.1 F (36.7 C), temperature source Oral, resp. rate 18, weight 112.311 kg (247 lb 9.6 oz), last menstrual period 07/31/2014.  Physical Exam  Constitutional: She is oriented to person, place, and time. She appears well-developed and well-nourished.  HENT:  Head: Normocephalic.  Eyes: Pupils are equal, round, and reactive to light.  Neck: Normal range of motion.  Cardiovascular: Normal rate, regular rhythm and normal heart sounds.   Respiratory: Effort normal and breath sounds normal.  GI: Soft. Bowel sounds are normal.  Genitourinary:  Small amount of dark red blood in vaginal vault cleared with 3 large cotton swabs; scant amount of mucous coming from cx os; SVE: closed/thick/high/soft  Musculoskeletal: Normal range of motion.  Neurological: She is alert and  oriented to person, place, and time. She has normal reflexes.  Psychiatric: She has a normal mood and affect. Her behavior is normal. Judgment and thought content normal.    MAU Course  Procedures NST OB Limited U/S - check placenta CBC - WNL Assessment and Plan  1) G2P1001, 34.2 wks 2) Vaginal Bleeding affecting pregnancy, third trimester 3) Class A2 GDM - on Glyburide  1) Discharge Home 2) PTL Precautions / Bleeding Precautions reviewed 3) Keep scheduled appt 10/28 with Dr. Cherly Hensen 4) Call the office, if bleeding worsens or not improved 5) Pelvic Rest until further notice  *Dr. Cherly Hensen notified of assessment and plan - agrees  Kenard Gower MSN, CNM 03/28/2015, 12:05 AM

## 2015-03-28 NOTE — MAU Provider Note (Signed)
History     CSN: [id number]  Arrival date and time: 03/28/15 1422   None     Chief Complaint  Patient presents with  . Vaginal Bleeding   HPI Comments: G2P1001 @34 .2 wks here for evaluation of bleeding. Was seen in MAU last night for brb-IC 2 days prior, evaluated with neg sono and sent home. Reports bright red VB started back this am around 0800 and has been intermittent throughout the day. Denies ctx but reports baby has been moving all day and has been very uncomfortable. No LOF. Pregnancy complicted by A2GDM on Glyburide-poor compliance and control, small BPD and low AF-evaluated by MFM, ovarian cyst, previous CS, hx PCOS, obesity, and persistent placenta previa although was found to be resolved on sono 5 days ago.   OB History    Gravida Para Term Preterm AB TAB SAB Ectopic Multiple Living   2 1 1       1       Past Medical History  Diagnosis Date  . Gestational diabetes mellitus, antepartum   . Gestational diabetes   . Vaginal discharge during pregnancy in third trimester 03/27/2015  . Anemia     Past Surgical History  Procedure Laterality Date  . None    . Cesarean section      Family History  Problem Relation Age of Onset  . Diabetes Other   . Stroke Other   . Obesity Other   . Heart disease Other     Social History  Substance Use Topics  . Smoking status: Former Smoker    Quit date: 08/25/2014  . Smokeless tobacco: None  . Alcohol Use: No    Allergies: No Known Allergies  Prescriptions prior to admission  Medication Sig Dispense Refill Last Dose  . acetaminophen (TYLENOL) 500 MG tablet Take 1,000 mg by mouth every 6 (six) hours as needed for moderate pain.   03/27/2015 at Unknown time  . Bisacodyl (DULCOLAX PO) Take 1 tablet by mouth daily as needed (constipation).   03/27/2015 at Unknown time  . CALCIUM PO Take 1 tablet by mouth daily.   Past Week at Unknown time  . glyBURIDE (DIABETA) 5 MG tablet Take 5 mg by mouth 2 (two) times daily with a  meal.   03/28/2015 at Unknown time  . Iron TABS Take 1 each by mouth daily.   Past Week at Unknown time  . Prenatal Multivit-Min-Fe-FA (PRENATAL VITAMINS PO) Take 1 each by mouth daily.   03/27/2015 at Unknown time    Review of Systems  Constitutional: Negative.   HENT: Negative.   Eyes: Negative.   Respiratory: Negative.   Cardiovascular: Negative.   Gastrointestinal: Negative.   Genitourinary: Negative.   Musculoskeletal: Negative.   Skin: Negative.   Neurological: Negative.   Endo/Heme/Allergies: Negative.   Psychiatric/Behavioral: Negative.    Physical Exam   Blood pressure 119/80, pulse 79, temperature 98.1 F (36.7 C), temperature source Oral, resp. rate 18, weight 112.311 kg (247 lb 9.6 oz), last menstrual period 07/31/2014.  Physical Exam  Constitutional: She is oriented to person, place, and time. She appears well-developed and well-nourished.  HENT:  Head: Normocephalic and atraumatic.  Neck: Normal range of motion. Neck supple.  Cardiovascular: Normal rate and regular rhythm.   Respiratory: Effort normal and breath sounds normal.  GI: Soft.  gravid  Genitourinary:  Speculum: visually closed, brb from os-small amt-no active bleeding; moderate amt in culdesac SVE: 1.5/70/-2, vtx  Musculoskeletal: Normal range of motion.  Neurological: She is alert and  oriented to person, place, and time.  Skin: Skin is warm and dry.  Psychiatric: She has a normal mood and affect.  EFM: 130 bpm, mod variability, +accels, no decels Toco: q2-3, mild  MAU Course  Procedures  Sono: vtx, normal AFI, clot noted to inferior and superior edge of placenta   Assessment and Plan  34.[redacted] weeks gestation Placental abruption A2GDM Cat I FHT  Admit to inpatient, continuous EFM, MgSO4 for neuro protection, NPO, MFM consult, NICU consult  Dr. Cherly Hensen notified of A/P, MD to manage  [facility], Melena Hayes, N 03/28/2015, 4:59 PM cou

## 2015-03-29 ENCOUNTER — Encounter (HOSPITAL_COMMUNITY): Payer: Self-pay | Admitting: *Deleted

## 2015-03-29 DIAGNOSIS — O24419 Gestational diabetes mellitus in pregnancy, unspecified control: Secondary | ICD-10-CM

## 2015-03-29 LAB — RPR: RPR Ser Ql: NONREACTIVE

## 2015-03-29 LAB — ABO/RH: ABO/RH(D): O POS

## 2015-03-29 MED ORDER — ZOLPIDEM TARTRATE 5 MG PO TABS: 5.0000 mg | ORAL_TABLET | Freq: Every evening | ORAL | Status: DC | PRN

## 2015-03-29 MED ORDER — SODIUM CHLORIDE 0.9 % IJ SOLN: 3.0000 mL | INTRAMUSCULAR | Status: DC | PRN

## 2015-03-29 MED ORDER — OXYCODONE-ACETAMINOPHEN 5-325 MG PO TABS
2.0000 | ORAL_TABLET | ORAL | Status: DC | PRN
  Administered 2015-03-30 – 2015-03-31 (×4): 2 via ORAL
  Filled 2015-03-29 (×4): qty 2

## 2015-03-29 MED ORDER — ONDANSETRON HCL 4 MG/2ML IJ SOLN
INTRAMUSCULAR | Status: AC
  Filled 2015-03-29: qty 2

## 2015-03-29 MED ORDER — SIMETHICONE 80 MG PO CHEW: 80.0000 mg | CHEWABLE_TABLET | ORAL | Status: DC | PRN

## 2015-03-29 MED ORDER — BUPIVACAINE HCL (PF) 0.25 % IJ SOLN
INTRAMUSCULAR | Status: DC | PRN
  Administered 2015-03-28: 10 mL

## 2015-03-29 MED ORDER — SCOPOLAMINE 1 MG/3DAYS TD PT72
MEDICATED_PATCH | TRANSDERMAL | Status: DC | PRN
  Administered 2015-03-28: 1 via TRANSDERMAL

## 2015-03-29 MED ORDER — ONDANSETRON HCL 4 MG/2ML IJ SOLN: 4.0000 mg | Freq: Once | INTRAMUSCULAR | Status: DC | PRN

## 2015-03-29 MED ORDER — PHENYLEPHRINE 8 MG IN D5W 100 ML (0.08MG/ML) PREMIX OPTIME
INJECTION | INTRAVENOUS | Status: AC
  Filled 2015-03-29: qty 100

## 2015-03-29 MED ORDER — DIPHENHYDRAMINE HCL 25 MG PO CAPS: 25.0000 mg | ORAL_CAPSULE | ORAL | Status: DC | PRN

## 2015-03-29 MED ORDER — NALOXONE HCL 0.4 MG/ML IJ SOLN
0.4000 mg | INTRAMUSCULAR | Status: DC | PRN
Start: 2015-03-29 — End: 2015-03-30

## 2015-03-29 MED ORDER — LACTATED RINGERS IV SOLN
INTRAVENOUS | Status: DC | PRN
  Administered 2015-03-28: via INTRAVENOUS

## 2015-03-29 MED ORDER — DIBUCAINE 1 % RE OINT: 1.0000 "application " | TOPICAL_OINTMENT | RECTAL | Status: DC | PRN

## 2015-03-29 MED ORDER — SCOPOLAMINE 1 MG/3DAYS TD PT72
MEDICATED_PATCH | TRANSDERMAL | Status: AC
  Filled 2015-03-29: qty 1

## 2015-03-29 MED ORDER — 0.9 % SODIUM CHLORIDE (POUR BTL) OPTIME
TOPICAL | Status: DC | PRN
  Administered 2015-03-29: 1000 mL

## 2015-03-29 MED ORDER — WITCH HAZEL-GLYCERIN EX PADS: 1.0000 "application " | MEDICATED_PAD | CUTANEOUS | Status: DC | PRN

## 2015-03-29 MED ORDER — LACTATED RINGERS IV SOLN: INTRAVENOUS | Status: DC

## 2015-03-29 MED ORDER — IBUPROFEN 600 MG PO TABS
600.0000 mg | ORAL_TABLET | Freq: Four times a day (QID) | ORAL | Status: DC
  Administered 2015-03-29 – 2015-03-31 (×10): 600 mg via ORAL
  Filled 2015-03-29 (×10): qty 1

## 2015-03-29 MED ORDER — SODIUM CHLORIDE 0.9 % IJ SOLN: 3.0000 mL | Freq: Two times a day (BID) | INTRAMUSCULAR | Status: DC

## 2015-03-29 MED ORDER — DIPHENHYDRAMINE HCL 25 MG PO CAPS: 25.0000 mg | ORAL_CAPSULE | Freq: Four times a day (QID) | ORAL | Status: DC | PRN

## 2015-03-29 MED ORDER — PRENATAL MULTIVITAMIN CH
1.0000 | ORAL_TABLET | Freq: Every day | ORAL | Status: DC
  Administered 2015-03-29 – 2015-03-31 (×3): 1 via ORAL
  Filled 2015-03-29 (×3): qty 1

## 2015-03-29 MED ORDER — METHYLERGONOVINE MALEATE 0.2 MG PO TABS: 0.2000 mg | ORAL_TABLET | ORAL | Status: DC | PRN

## 2015-03-29 MED ORDER — DEXAMETHASONE SODIUM PHOSPHATE 10 MG/ML IJ SOLN
INTRAMUSCULAR | Status: DC | PRN
  Administered 2015-03-28: 10 mg via INTRAVENOUS

## 2015-03-29 MED ORDER — DIPHENHYDRAMINE HCL 50 MG/ML IJ SOLN: 12.5000 mg | INTRAMUSCULAR | Status: DC | PRN

## 2015-03-29 MED ORDER — FERROUS SULFATE 325 (65 FE) MG PO TABS
325.0000 mg | ORAL_TABLET | Freq: Two times a day (BID) | ORAL | Status: DC
  Administered 2015-03-29 – 2015-03-31 (×4): 325 mg via ORAL
  Filled 2015-03-29 (×4): qty 1

## 2015-03-29 MED ORDER — NALBUPHINE HCL 10 MG/ML IJ SOLN: 5.0000 mg | Freq: Once | INTRAMUSCULAR | Status: AC | PRN

## 2015-03-29 MED ORDER — SENNOSIDES-DOCUSATE SODIUM 8.6-50 MG PO TABS
2.0000 | ORAL_TABLET | ORAL | Status: DC
Start: 2015-03-30 — End: 2015-03-31
  Administered 2015-03-29 – 2015-03-30 (×2): 2 via ORAL
  Filled 2015-03-29 (×2): qty 2

## 2015-03-29 MED ORDER — SODIUM CHLORIDE 0.9 % IV SOLN: 250.0000 mL | INTRAVENOUS | Status: DC

## 2015-03-29 MED ORDER — CEFAZOLIN SODIUM-DEXTROSE 2-3 GM-% IV SOLR
INTRAVENOUS | Status: AC
  Filled 2015-03-29: qty 50

## 2015-03-29 MED ORDER — FENTANYL CITRATE (PF) 100 MCG/2ML IJ SOLN: 25.0000 ug | INTRAMUSCULAR | Status: DC | PRN

## 2015-03-29 MED ORDER — OXYCODONE-ACETAMINOPHEN 5-325 MG PO TABS
1.0000 | ORAL_TABLET | ORAL | Status: DC | PRN
  Administered 2015-03-30 (×4): 1 via ORAL
  Filled 2015-03-29 (×4): qty 1

## 2015-03-29 MED ORDER — SIMETHICONE 80 MG PO CHEW
80.0000 mg | CHEWABLE_TABLET | ORAL | Status: DC
  Administered 2015-03-29 – 2015-03-30 (×2): 80 mg via ORAL
  Filled 2015-03-29 (×2): qty 1

## 2015-03-29 MED ORDER — SIMETHICONE 80 MG PO CHEW
80.0000 mg | CHEWABLE_TABLET | Freq: Three times a day (TID) | ORAL | Status: DC
Start: 2015-03-29 — End: 2015-03-31
  Administered 2015-03-30 – 2015-03-31 (×4): 80 mg via ORAL
  Filled 2015-03-29 (×4): qty 1

## 2015-03-29 MED ORDER — NALBUPHINE HCL 10 MG/ML IJ SOLN
5.0000 mg | INTRAMUSCULAR | Status: DC | PRN
  Administered 2015-03-29: 5 mg via SUBCUTANEOUS

## 2015-03-29 MED ORDER — ONDANSETRON HCL 4 MG/2ML IJ SOLN
INTRAMUSCULAR | Status: DC | PRN
  Administered 2015-03-29: 4 mg via INTRAVENOUS

## 2015-03-29 MED ORDER — OXYTOCIN 40 UNITS IN LACTATED RINGERS INFUSION - SIMPLE MED
62.5000 mL/h | INTRAVENOUS | Status: AC
Start: 2015-03-29 — End: 2015-03-29

## 2015-03-29 MED ORDER — LANOLIN HYDROUS EX OINT: 1.0000 "application " | TOPICAL_OINTMENT | CUTANEOUS | Status: DC | PRN

## 2015-03-29 MED ORDER — NALBUPHINE HCL 10 MG/ML IJ SOLN
5.0000 mg | Freq: Once | INTRAMUSCULAR | Status: AC | PRN
  Administered 2015-03-29: 5 mg via SUBCUTANEOUS

## 2015-03-29 MED ORDER — METHYLERGONOVINE MALEATE 0.2 MG/ML IJ SOLN: 0.2000 mg | INTRAMUSCULAR | Status: DC | PRN

## 2015-03-29 MED ORDER — NALOXONE HCL 2 MG/2ML IJ SOSY
1.0000 ug/kg/h | PREFILLED_SYRINGE | INTRAVENOUS | Status: DC | PRN
  Filled 2015-03-29: qty 2

## 2015-03-29 MED ORDER — NALBUPHINE HCL 10 MG/ML IJ SOLN
5.0000 mg | INTRAMUSCULAR | Status: DC | PRN
  Filled 2015-03-29: qty 1

## 2015-03-29 MED ORDER — BISACODYL 10 MG RE SUPP: 10.0000 mg | Freq: Every day | RECTAL | Status: DC | PRN

## 2015-03-29 MED ORDER — MEPERIDINE HCL 25 MG/ML IJ SOLN
INTRAMUSCULAR | Status: DC | PRN
  Administered 2015-03-28 – 2015-03-29 (×2): 12.5 mg via INTRAVENOUS

## 2015-03-29 MED ORDER — FLEET ENEMA 7-19 GM/118ML RE ENEM: 1.0000 | ENEMA | Freq: Every day | RECTAL | Status: DC | PRN

## 2015-03-29 MED ORDER — OXYTOCIN 10 UNIT/ML IJ SOLN
INTRAMUSCULAR | Status: AC
  Filled 2015-03-29: qty 4

## 2015-03-29 MED ORDER — ONDANSETRON HCL 4 MG/2ML IJ SOLN: 4.0000 mg | Freq: Three times a day (TID) | INTRAMUSCULAR | Status: DC | PRN

## 2015-03-29 MED ORDER — ACETAMINOPHEN 325 MG PO TABS: 650.0000 mg | ORAL_TABLET | ORAL | Status: DC | PRN

## 2015-03-29 MED ORDER — SCOPOLAMINE 1 MG/3DAYS TD PT72
1.0000 | MEDICATED_PATCH | Freq: Once | TRANSDERMAL | Status: DC
  Filled 2015-03-29: qty 1

## 2015-03-29 MED ORDER — LACTATED RINGERS IV SOLN
40.0000 [IU] | INTRAVENOUS | Status: DC | PRN
  Administered 2015-03-28: 40 [IU] via INTRAVENOUS

## 2015-03-29 MED ORDER — DEXAMETHASONE SODIUM PHOSPHATE 10 MG/ML IJ SOLN
INTRAMUSCULAR | Status: AC
  Filled 2015-03-29: qty 1

## 2015-03-29 MED ORDER — MENTHOL 3 MG MT LOZG: 1.0000 | LOZENGE | OROMUCOSAL | Status: DC | PRN

## 2015-03-29 MED ORDER — MEPERIDINE HCL 25 MG/ML IJ SOLN: 6.2500 mg | INTRAMUSCULAR | Status: DC | PRN

## 2015-03-29 NOTE — Progress Notes (Signed)
Patient ID: Tamara PiccoloMonique Boyle, female   DOB: 04/01/1986, 29 y.o.   MRN: [id number] Patient in NICU with baby at the time of rounds. Will return later today for rounds.  Kenard GowerAWSON, Dmarcus Decicco, M, MSN, CNM 03/29/2015, 8:36 AM

## 2015-03-29 NOTE — Transfer of Care (Signed)
Immediate Anesthesia Transfer of Care Note  Patient: Tamara PiccoloMonique Burak  Procedure(s) Performed: Procedure(s): CESAREAN SECTION (N/A)  Patient Location: PACU  Anesthesia Type:Spinal  Level of Consciousness: awake, alert , oriented and patient cooperative  Airway & Oxygen Therapy: Patient Spontanous Breathing  Post-op Assessment: Report given to RN and Post -op Vital signs reviewed and stable  Post vital signs: Reviewed and stable  Last Vitals:  Filed Vitals:   03/28/15 2301  BP: 133/70  Pulse: 89  Temp:   Resp:     Complications: No apparent anesthesia complications

## 2015-03-29 NOTE — Anesthesia Postprocedure Evaluation (Signed)
  Anesthesia Post-op Note  Patient: Tamara PiccoloMonique Boyle  Procedure(s) Performed: Procedure(s): CESAREAN SECTION (N/A)  Patient Location: PACU and Women's Unit  Anesthesia Type:Spinal  Level of Consciousness: awake, alert  and oriented  Airway and Oxygen Therapy: Patient Spontanous Breathing and Patient connected to nasal cannula oxygen  Post-op Pain: none  Post-op Assessment: Post-op Vital signs reviewed, Patient's Cardiovascular Status Stable, Respiratory Function Stable, Patent Airway, No signs of Nausea or vomiting, Adequate PO intake, Pain level controlled, No headache, No backache and Patient able to bend at knees      Post-op Vital Signs: Reviewed and stable  Last Vitals:  Filed Vitals:   03/29/15 0720  BP: 121/63  Pulse: 91  Temp: 36.8 C  Resp: 22    Complications: No apparent anesthesia complications

## 2015-03-29 NOTE — Progress Notes (Signed)
sono results noted: BPP 4/8 Oligohydramnios Continued vaginal bleeding  given above findings suspect that recent gush of blood was probably SROM admixed with underlying placental abruption. Pt advised of need for delivery now. Given prematurity, expect NICU admit. BMZ x 1 given. Disc issue of permanent sterilization. Pt opted not to proceed with this given prematurity. OR notified

## 2015-03-29 NOTE — Anesthesia Postprocedure Evaluation (Signed)
  Anesthesia Post-op Note  Patient: Tamara PiccoloMonique Boyle  Procedure(s) Performed: Procedure(s) (LRB): CESAREAN SECTION (N/A)  Patient Location: PACU  Anesthesia Type: Spinal  Level of Consciousness: awake and alert   Airway and Oxygen Therapy: Patient Spontanous Breathing  Post-op Pain: mild  Post-op Assessment: Post-op Vital signs reviewed, Patient's Cardiovascular Status Stable, Respiratory Function Stable, Patent Airway and No signs of Nausea or vomiting  Last Vitals:  Filed Vitals:   03/29/15 0130  BP: 130/77  Pulse: 98  Temp:   Resp: 19    Post-op Vital Signs: stable   Complications: No apparent anesthesia complications

## 2015-03-29 NOTE — Progress Notes (Signed)
Inpatient Diabetes Program Recommendations  AACE/ADA: New Consensus Statement on Inpatient Glycemic Control (2015)  Target Ranges:  Prepandial:   less than 140 mg/dL      Peak postprandial:   less than 180 mg/dL (1-2 hours)      Critically ill patients:  140 - 180 mg/dL   Review of Glycemic Control Referral received for [redacted] weeks gestation delivered by c/sction yesterday, 03/27/16. Patient had been on glyburide during pregnancy.  Unless patient has hx of dm 2 prior to pregnancy, doubt that patient will need any glucose control medications. However, will follow and recommend as needed should cbg's elevate. Will follow and glad to assist with further concerns for this patient. Patient will need to follow up with a primary MD for testing for glucose control at first post-partum check-up at 6 weeks.   Thank you Lenor CoffinAnn Wymon Swaney, RN, MSN, CDE  Diabetes Inpatient Program Office: [phone] Pager: [phone] 8:00 am to 5:00 pm

## 2015-03-29 NOTE — Progress Notes (Signed)
Patient ID: Tamara PiccoloMonique Boyle, female   DOB: 11/11/1985, 29 y.o.   MRN: [id number] Subjective: S/P Repeat Cesarean Delivery d/t Placenta Abruption POD# 1 Information for the patient's newborn:  Al DecantLindsay, Girl Delia [[id number]][[id number]]  female  Reports feeling well.  Just a little sore. Feeding: bottle - pumping breast milk Patient reports tolerating PO.  Breast symptoms: none Pain controlled with ibuprofen (OTC) and narcotic analgesics including Percocet Denies HA/SOB/C/P/N/V/dizziness. Flatus present. No BM. She reports vaginal bleeding as normal, without clots.  She is ambulating, Foley discontinued 2 hours ago. Not urinating yet, but hopeful.     Objective:   VS:  Filed Vitals:   03/29/15 0830 03/29/15 1057 03/29/15 1245 03/29/15 1456  BP: 120/72 124/75 112/69 126/58  Pulse: 101 90 74 84  Temp: 98.6 F (37 C) 98.3 F (36.8 C) 97.9 F (36.6 C) 98.6 F (37 C)  TempSrc: Oral Oral Oral Oral  Resp: 20 18 21 20   Height:      Weight:      SpO2:  98% 98% 100%     Intake/Output Summary (Last 24 hours) at 03/29/15 1652 Last data filed at 03/29/15 1329  Gross per 24 hour  Intake 2304.97 ml  Output   2425 ml  Net -120.03 ml        Recent Labs  03/27/15 2325 03/28/15 1918  WBC 5.4 7.6  HGB 12.4 12.4  HCT 36.7 37.3  PLT 205 195     Blood type: O POS (10/25 1918)  Rubella: Nonimmune (04/27 0000)     Physical Exam:   General: alert, cooperative and no distress  CV: Regular rate and rhythm, S1S2 present or without murmur or extra heart sounds  Resp: clear  Abdomen: soft, nontender, normal bowel sounds  Incision: clean, dry, intact and Skin well-approximated staples  Uterine Fundus: firm, 1 FB below umbilicus, nontender  Lochia: minimal  Ext: extremities normal, atraumatic, no cyanosis or edema, Homans sign is negative, no sign of DVT and no edema, redness or tenderness in the calves or thighs   Assessment/Plan: 29 y.o.   POD# 1.  S/P Cesarean Delivery.  Indications: Repeat  Cesarean Delivery and Placenta Abruption                Principal Problem:   Postpartum care following cesarean delivery (10/25) Active Problems:   Placental abruption in third trimester   GDM, class A2  Doing well, stable.               Regular diet as tolerated Ambulate 2-3 times/day Routine post-op care  Kenard GowerAWSON, Sesar Madewell, M, MSN, CNM 03/29/2015, 4:52 PM

## 2015-03-29 NOTE — Brief Op Note (Signed)
03/28/2015 - 03/29/2015  12:52 AM  PATIENT:  Tamara Boyle  29 y.o. female  PRE-OPERATIVE DIAGNOSIS:  Placental Abruption, Previous Cesarean Section, BPP 4/10, oligohydramnios, Class A2 GDM, IUP @ 34 2/7 weeks  POST-OPERATIVE DIAGNOSIS:  Placental Abruption,  Cesarean scar Dehiscence, oligohydramnios, IUP @ 34 2/7 weeks, Class A2 GDM, BPP 4/10  PROCEDURE:  Urgent Repeat Cesarean section, Sharl MaKerr hysterotomy  SURGEON:  Surgeon(s) and Role:    * Maxie BetterSheronette Brittay Mogle, MD - Primary  PHYSICIAN ASSISTANT:   ASSISTANTS: Donette LarryMelanie Bhambri, CNM   ANESTHESIA:   spinal Findings: live female with scant amniotic fluid, anterior placenta with abruption, 1.5cm wound dehiscence on right, nl tubes,(left paratubal cyst), bilateral polycystic ovaries, ratty placenta with fundal necrotic membranous tissue Cord ph done: 7.20  EBL:  Total I/O In: 1600 [I.V.:1600] Out: 1300 [Urine:600; Blood:700]  BLOOD ADMINISTERED:none  DRAINS: none   LOCAL MEDICATIONS USED:  MARCAINE     SPECIMEN:  Source of Specimen:  placenta  DISPOSITION OF SPECIMEN:  PATHOLOGY  COUNTS:  YES  TOURNIQUET:  * No tourniquets in log *  DICTATION: .Other Dictation: Dictation Number [id number]  PLAN OF CARE: Admit to inpatient   PATIENT DISPOSITION:  PACU - hemodynamically stable.   Delay start of Pharmacological VTE agent (>24hrs) due to surgical blood loss or risk of bleeding: no

## 2015-03-29 NOTE — Addendum Note (Signed)
Addendum  created 03/29/15 0724 by Shanon PayorSuzanne M Kimball Manske, CRNA   Modules edited: Notes Section   Notes Section:  File: [id number]

## 2015-03-30 ENCOUNTER — Encounter (HOSPITAL_COMMUNITY): Payer: Self-pay | Admitting: Obstetrics and Gynecology

## 2015-03-30 LAB — CBC
HEMATOCRIT: 30.2 % — AB (ref 36.0–46.0)
HEMOGLOBIN: 10.1 g/dL — AB (ref 12.0–15.0)
MCH: 27.1 pg (ref 26.0–34.0)
MCHC: 33.4 g/dL (ref 30.0–36.0)
MCV: 81 fL (ref 78.0–100.0)
Platelets: 207 10*3/uL (ref 150–400)
RBC: 3.73 MIL/uL — ABNORMAL LOW (ref 3.87–5.11)
RDW: 14.8 % (ref 11.5–15.5)
WBC: 11 10*3/uL — ABNORMAL HIGH (ref 4.0–10.5)

## 2015-03-30 MED ORDER — MAGNESIUM OXIDE 400 (241.3 MG) MG PO TABS
200.0000 mg | ORAL_TABLET | Freq: Every day | ORAL | Status: DC
Start: 2015-03-30 — End: 2015-03-31
  Administered 2015-03-30 – 2015-03-31 (×2): 200 mg via ORAL
  Filled 2015-03-30 (×2): qty 0.5

## 2015-03-30 MED ORDER — MEASLES, MUMPS & RUBELLA VAC ~~LOC~~ INJ
0.5000 mL | INJECTION | Freq: Once | SUBCUTANEOUS | Status: AC
  Administered 2015-03-31: 0.5 mL via SUBCUTANEOUS
  Filled 2015-03-30: qty 0.5

## 2015-03-30 NOTE — Op Note (Signed)
Tamara Boyle, Tamara Boyle             ACCOUNT NO.:  [id number]  MEDICAL RECORD NO.:  [id number]  LOCATION:  9302                          FACILITY:  [facility]  PHYSICIAN:  Maxie Better, M.D.DATE OF BIRTH:  1986/02/23  DATE OF PROCEDURE:  03/28/2015 DATE OF DISCHARGE:                              OPERATIVE REPORT   PREOPERATIVE DIAGNOSES: 1. Placental abruption. 2. Oligohydramnios. 3. Previous cesarean section. 4. Biophysical profile 4/10. 5. Class A2 gestational diabetes. 6. Intrauterine gestation at 34-2/7th weeks.  PROCEDURES: 1. Repeat cesarean section, Kerr hysterotomy.  POSTOPERATIVE DIAGNOSES: 1. Placental abruption. 2. Cesarean scar dehiscence. 3. Oligohydramnios. 4. Intrauterine gestation at 34-2/7th weeks. 5. Class A2 gestational diabetes.  ANESTHESIA:  Spinal.  SURGEON:  Maxie Better, M.D.  ASSISTANT:  Donette Larry, CNM  DESCRIPTION OF PROCEDURE:  Under adequate spinal anesthesia, the patient was placed in supine position with a left lateral tilt.  She was sterilely prepped and draped in usual fashion.  Indwelling Foley catheter was sterilely placed.  A 0.25% Marcaine was injected along the previous Pfannenstiel skin incision.  Pfannenstiel skin incision was then made, carried down to the rectus fascia.  The rectus fascia was opened transversely.  Rectus fascia was then bluntly and sharply dissected off the rectus muscle in the superior and inferior fashion. Omental adhesions were noted in the anterior peritoneum.  Therefore, the omental adhesion was displaced to the patient's left and subsequently, the parietal peritoneum was identified and opened and extended.  On opening the abdominal cavity, dark blood was noted.  An Alexis self- retaining retractor was then placed.  Adhesions of the bladder to the serosa of the uterus was noted superiorly which were then lysed.  On looking at the lower uterine segment, there was a 1.5 cm window in the uterine  scar with evidence of the blood clot was noted.  A finger was placed through that site and uterine incision was extended bluntly using my fingers.  At that point, clotted blood was noted as well as the placenta which was anterior was partially extruding.  Through all that, I went and extracted live female, still intact in the amniotic sac, minimal fluid was noted.  The amniotic sac was removed and subsequently, baby was fully delivered.  Cord was clamped, cut, and the baby was transferred to the awaiting pediatricians who assigned Apgars of 8 and 9 at 1 and 5 minutes.  The placenta easily was removed and sent to Pathology.  The uterine incision had no extension.  The upper portion of the uterine cavity was notable for necrotic-appearing membranous tissue which required ring forceps removal, swiping with sponge, lap sponges were  not successful in removing.  The ring clamp was then used to remove the necrotic material and curettaged then to gently curette the upper fundal region until all membranous material was removed.  At that point, the uterine incision was closed in 2 layers, the first layer with 0 Monocryl running locked stitch, second layer was imbricated using 0 Monocryl suture.  The bladder was displaced further inferiorly.  Normal tubes were noted bilaterally.  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 Cord pH and cord blood had been obtained.  Abdomen was irrigated and suctioned.  Interceed was  placed in inverted T fashion in the lower uterine segment.  The parietal peritoneum was then closed with 2-0 Vicryl.  The rectus fascia was closed with 0 Vicryl x2.  The subcutaneous area was irrigated, small bleeders cauterized.  Interrupted 2-0 plain sutures placed and the skin approximated with Ethicon staples.  SPECIMEN:  Placenta, sent to Pathology.  ESTIMATED BLOOD LOSS:  700 mL.  URINE OUTPUT:  600 mL.  INTRAOPERATIVE FLUID:  2100 mL.  COUNTS:  Sponge and instrument counts x2  was correct.  COMPLICATIONS:  None.  The baby was transferred to the neonatal intensive care due to prematurity.     Maxie BetterSheronette Raevin Wierenga, M.D.     [place]/MEDQ  D:  03/29/2015  T:  03/30/2015  Job:  [id number]

## 2015-03-30 NOTE — Progress Notes (Addendum)
POD # 2  Subjective: Pt reports feeling well/ Pain controlled with Motrin and Percocet Tolerating po/Voiding without problems/ No n/v/ Flatus present Activity: ad lib Bleeding is light Newborn info:  Information for the patient's newborn:  Al DecantLindsay, Girl Shann [[id number]][[id number]]  female   Feeding: breastpumping  Objective: VS:  Filed Vitals:   03/29/15 1456 03/29/15 1734 03/29/15 2146 03/30/15 0522  BP: 126/58 109/66 108/78 109/63  Pulse: 84 79 80 77  Temp: 98.6 F (37 C) 98.4 F (36.9 C) 98.2 F (36.8 C) 98.4 F (36.9 C)  TempSrc: Oral Axillary Oral Oral  Resp: 20 18 18 18   Height:      Weight:      SpO2: 100% 100% 98% 99%    I&O: Intake/Output      10/26 0701 - 10/27 0700 10/27 0701 - 10/28 0700   P.O.  120   I.V. (mL/kg)     Total Intake(mL/kg)  120 (1.1)   Urine (mL/kg/hr) 1100 (0.4)    Blood     Total Output 1100     Net -1100 +120          LABS:  Recent Labs  03/28/15 1918 03/30/15 0525  WBC 7.6 11.0*  HGB 12.4 10.1*  HCT 37.3 30.2*  PLT 195 207    Blood type: --/--/O POS, O POS (10/25 1918) Rubella: Nonimmune (04/27 0000)    Physical Exam:  General: alert, cooperative and no distress CV: Regular rate and rhythm Resp: CTA bilaterally Abdomen: soft, nontender, normal bowel sounds Uterine Fundus: firm, below umbilicus, nontender Incision: Covered with Tegaderm and honeycomb dressing; no significant drainage, edema, bruising, or erythema; well approximated with staples Lochia: minimal Ext: extremities normal, atraumatic, no cyanosis or edema and Homans sign is negative, no sign of DVT   Assessment/: POD # 2/ G2P1101/ S/P C/Section d/t placental abruption, BPP 4/10  A2GDM, delivered ABL anemia Rubella non-immune Doing well  Plan: FBS in am Continue routine post op orders Anticipate discharge home tomorrow   Signed: Donette LarryBHAMBRI, Pasha Gadison, Dorris CarnesN, MSN, CNM 03/30/2015, 11:23 AM

## 2015-03-30 NOTE — Progress Notes (Signed)
Pt walking with spouse to NICU to visit baby.

## 2015-03-30 NOTE — Progress Notes (Signed)
CSW acknowledges NICU admission.    Patient screened out for psychosocial assessment since none of the following apply:  Psychosocial stressors documented in mother or baby's chart  Gestation less than 32 weeks  Code at delivery   Critically ill infant  Infant with anomalies  Please contact the Clinical Social Worker if specific needs arise, or by MOB's request.       

## 2015-03-30 NOTE — Lactation Note (Signed)
This note was copied from the chart of Tamara Valora PiccoloMonique Maul. Lactation Consultation Note  Patient Name: Tamara Boyle ZOXWR'UToday's Date: 03/30/2015 Reason for consult: Initial assessment;NICU baby  NICU baby 6233 hours old, 6264w4d CGA. Mom states that she briefly attempted to nurse her first child, and she never really saw her milk increase very much. Mom reports that she has probably pumped 4 or 5 times total in 33 hours. Enc mom to use DEBP 8 times/24 hours for 15 minutes, followed by hand expression. Demonstrated hand expression with no colostrum present. Mom has large, pendulous breast with flat nipples. Assisted mom to use cloth diapers rolled up and placed under her breast for support. Mom states that she had a hard time keeping flanges against her breasts while pumping yesterday, but reports increased comfort today with additional support. Mom states that she did see colostrum from right breast yesterday while pumping. Assisted mom to use DEBP and pump flanges seem to be a good fit. Mom pumped for 15 minutes and able to get a slight bit of colostrum on flanges, but not enough to collect. Enc mom to rub into nipples.   Mom give colostrum containers with instructions. Mom also given NICU booklet and LC brochure with review. Mom aware of OP/BFSG, community resources, and [facility] after [facility]. Mom has DEBP and enc to call insurance company as well. Enc mom to offer STS and nuzzling/latching at breast as she and baby able. Mom aware of pumping rooms in NICU.  Maternal Data Has patient been taught Hand Expression?: Yes Does the patient have breastfeeding experience prior to this delivery?: Yes  Feeding Feeding Type: Formula Length of feed: 10 min  LATCH Score/Interventions                      Lactation Tools Discussed/Used Pump Review: Setup, frequency, and cleaning;Milk Storage Initiated by:: Bedside RN. Date initiated:: 03/28/15   Consult Status Consult  Status: Follow-up Date: 03/31/15 Follow-up type: In-patient    [name], [name] 03/30/2015, 9:31 AM

## 2015-03-30 NOTE — Progress Notes (Signed)
Discussed importance of regular pumping for milk production; discussed supply and demand cycle

## 2015-03-31 MED ORDER — OXYCODONE-ACETAMINOPHEN 5-325 MG PO TABS: 1.0000 | ORAL_TABLET | ORAL | Status: DC | PRN

## 2015-03-31 MED ORDER — MAGNESIUM OXIDE 400 (241.3 MG) MG PO TABS: 200.0000 mg | ORAL_TABLET | Freq: Every day | ORAL | Status: DC

## 2015-03-31 MED ORDER — IBUPROFEN 800 MG PO TABS: 800.0000 mg | ORAL_TABLET | Freq: Three times a day (TID) | ORAL | Status: DC

## 2015-03-31 NOTE — Discharge Summary (Signed)
OB Discharge Summary  Patient Name: Tamara Boyle DOB: [birth year] MRN: [id number]  Date of admission: 03/28/2015  Admitting diagnosis: Placenta abruption, Class A2 GDM, previous C/S, IUP @ 34 2/7 weeks  Intrauterine pregnancy: [redacted]w[redacted]d    Secondary diagnosis:class A2 GDM with poor compliance / previous cesarean section  Date of discharge: 03/31/2015     Discharge diagnosis: Placental abruption, PPROM(oligohydramnios), Class A2 GDM, Previous C/S, PTB @ 3422/7 weeks, cesarean scar wound dehiscence   Prenatal history: G2P1101   EDC : 05/07/2015, by Last Menstrual Period  [facility]  Primary provider : Cherrelle Plante Prenatal course complicated by class A2 GDM poor compliance / previous cesarean section / hx previa - resolved/previous cesarean  Prenatal Labs: ABO, Rh: --/--/O POS, O POS (10/25 1918)  Antibody: NEG (10/25 1918) Rubella: Nonimmune (04/27 0000)  / MMR booster given prior to DC RPR: Non Reactive (10/25 1918)  HBsAg: Negative (04/27 0000)  HIV: Non-reactive (04/27 0000)                       Hospital course:  admit with recurrent vaginal bleeding in third trimester. Sonogram showed placental abruption, nl fluid and cervix notably  1 cm dilated . - BMZ initial dose given and magnesium sulfate for preterm labor and allowance for steroid.  Continued vaginal bleeding and progression into presumed  SROM as  repeat sono showed BPP 4/8, oligohydramnios / hx previous CS with planned BTL but deferred sterilization at time of repeat cesarean section due to premature birth - repeat cesarean section for non reassuring fetal testing and oligohydramnios. Uncomplicated postoperative course  Delivering PROVIDER: Jazline Cumbee                                                            Complications: cesarean scar wound dehiscence  Newborn Data: Live born female  Birth Weight: 5 lb 1.5 oz (2310 g) APGAR: 8, 9  Baby Feeding: Bottle and  Breast Disposition:NICU  Post partum procedures:MMR vaccine booster  Postpartum contraception: Not Discussed    Labs: Lab Results  Component Value Date   WBC 11.0* 03/30/2015   HGB 10.1* 03/30/2015   HCT 30.2* 03/30/2015   MCV 81.0 03/30/2015   PLT 207 03/30/2015   No flowsheet data found.  Physical Exam @ time of discharge:  Filed Vitals:   03/30/15 1200 03/30/15 1700 03/30/15 2324 03/31/15 0541  BP: 94/47 109/60 125/71 102/55  Pulse: 97 96 84 87  Temp: 98.3 F (36.8 C) 98.4 F (36.9 C) 98.1 F (36.7 C) 98.1 F (36.7 C)  TempSrc: Oral Oral Oral Oral  Resp: 20 18 18 16   Height:      Weight:      SpO2: 100% 99% 100% 100%    General: alert, cooperative and no distress Lochia: appropriate Uterine Fundus: firm Perineum: intact Incision: Healing well with no significant drainage, No significant erythema, dressing is clean, dry, and intact, honeycomb in place with staples Extremities: trace pedal edema DVT Evaluation: No evidence of DVT seen on physical exam.   Discharge instructions:  "Baby and Me Booklet" and [facility] Booklet  Discharge Medications:    Medication List    STOP taking these medications        CALCIUM  PO     glyBURIDE 5 MG tablet  Commonly known as:  DIABETA      TAKE these medications        acetaminophen 500 MG tablet  Commonly known as:  TYLENOL  Take 1,000 mg by mouth every 6 (six) hours as needed for moderate pain.     DULCOLAX PO  Take 1 tablet by mouth daily as needed (constipation).     ibuprofen 800 MG tablet  Commonly known as:  ADVIL,MOTRIN  Take 1 tablet (800 mg total) by mouth every 8 (eight) hours.     Iron Tabs  Take 1 each by mouth daily.     magnesium oxide 400 (241.3 MG) MG tablet  Commonly known as:  MAG-OX  Take 0.5-1 tablets (200-400 mg total) by mouth daily. Constipation prevention - take daily     oxyCODONE-acetaminophen 5-325 MG tablet  Commonly known as:  PERCOCET/ROXICET  Take 1-2 tablets by mouth  every 4 (four) hours as needed (for pain scale greater than 7).     PRENATAL VITAMINS PO  Take 1 each by mouth daily.        Diet: carb modified diet  Activity: Advance as tolerated. Pelvic rest x 6 weeks. No driving x 2 weeks / no exercise x 6 weeks - ok light walking  Follow up: Monday at [facility] for dressing and staple removal    Signed: Artelia Laroche CNM, MSN, [facility] 03/31/2015, 9:45 AM

## 2015-03-31 NOTE — Discharge Instructions (Signed)
Low carb diet at home - drink water 4-6 bottles per day  continue monitoring glucose levels - fasting and 2 hour post meal Notify [facility] if fasting > 100 or post-meals > 150

## 2015-03-31 NOTE — Progress Notes (Signed)
POSTOPERATIVE DAY # 3 S/P CS acute abruption  S:         Reports feeling well             Tolerating po intake / no nausea / no vomiting / + flatus / no BM             Bleeding is light             Pain controlled with motrin and percocet - still really sore between meds             Up ad lib / ambulatory/ voiding QS  Newborn stable in NICU - breast & bottle feeding  O:  VS: BP 102/55 mmHg  Pulse 87  Temp(Src) 98.1 F (36.7 C) (Oral)  Resp 16  Ht 5' 2"  (1.575 m)  Wt 112.038 kg (247 lb)  BMI 45.17 kg/m2  SpO2 100%  LMP 07/31/2014  Breastfeeding? Unknown   LABS:               Recent Labs  03/28/15 1918 03/30/15 0525  WBC 7.6 11.0*  HGB 12.4 10.1*  PLT 195 207               Bloodtype: --/--/O POS, O POS (10/25 1918)  Rubella: Nonimmune (04/27 0000)  - offered MMR booster                                        I&O: Intake/Output      10/27 0701 - 10/28 0700 10/28 0701 - 10/29 0700   P.O. 120    Total Intake(mL/kg) 120 (1.1)    Urine (mL/kg/hr)     Total Output       Net +120                       Physical Exam:             Alert and Oriented X3  Abdomen: soft, non-tender, non-distended, active BS             Fundus: firm, non-tender, Ueven             Dressing intact honeycomb              Incision:  approximated with staples / no erythema / no ecchymosis / scant dry drainage  Perineum: intact  Lochia: light  Extremities: trace pedal edema, no calf pain or tenderness, negative Homans  A:        POD # 3 S/P CS for abruption             P:        Routine postoperative care              DC home - [facility] booklet - instructions reviewed     Artelia Laroche CNM, MSN, [facility] 03/31/2015, 9:27 AM

## 2015-03-31 NOTE — Lactation Note (Signed)
This note was copied from the chart of Tamara Valora PiccoloMonique Raymundo. Lactation Consultation Note  Patient Name: Tamara Boyle ONGEX'BToday's Date: 03/31/2015 Reason for consult: Follow-up assessment;NICU baby;Infant < 6lbs;Pump rental   Follow  Up with mom prior to her D/C. Mom reports Tamara Boyle is doing well in NICU. She is on room air and she tool a bottle last night for the first time. Mom reports she is not pumping regularly and feels a guilty about that. Encouraged mom to pump 8-12 x in 24 hours followed by hand expression. Encouraged mom to also pump after visiting baby and to practice STS as much as possible. Mom is investigating whether insurance company pays for a pump or pump rental with an infant in NICU, will need to follow up with her later today in regards to pump rental.   Maternal Data Formula Feeding for Exclusion: No Has patient been taught Hand Expression?: Yes  Feeding Feeding Type: Formula Nipple Type: Slow - flow Length of feed: 35 min  LATCH Score/Interventions                      Lactation Tools Discussed/Used WIC Program: No Pump Review: Setup, frequency, and cleaning;Milk Storage   Consult Status Consult Status: Follow-up Date: 03/31/15 Follow-up type: In-patient    Silas FloodSharon S Cornie Herrington 03/31/2015, 10:37 AM

## 2015-03-31 NOTE — Progress Notes (Signed)
Patient discharge instructions reviewed; all questions answered at this time.  To follow up with Dr Cherly Hensenousins for suture removal. Honeycomb and sutures dry and intact.  Patient vital signs stable and pain controlled at discharge. To be walked to car by family member and staff person.  Will be driven home by family member in private vehicle.

## 2015-04-01 LAB — ANTIPHOSPHOLIPID SYNDROME EVAL, BLD
Anticardiolipin IgG: <9 GPL U/mL (ref 0–14)
Anticardiolipin IgM: <9 MPL U/mL (ref 0–12)
DRVVT: 40.4 s (ref 0.0–44.0)
PTT Lupus Anticoagulant: 40.4 s (ref 0.0–40.6)
Phosphatydalserine, IgG: 7 GPS IgG (ref 0–11)
Phosphatydalserine, IgM: 6 MPS IgM (ref 0–25)

## 2015-04-01 LAB — GLUCOSE, CAPILLARY: Glucose-Capillary: 83 mg/dL (ref 65–99)

## 2015-04-12 ENCOUNTER — Ambulatory Visit: Payer: Self-pay

## 2015-04-12 NOTE — Lactation Note (Signed)
This note was copied from the chart of Girl Valora PiccoloMonique Felicetti. Lactation Consultation Note  Patient Name: Girl Valora PiccoloMonique Gignac GLOVF'IToday's Date: 04/12/2015 Reason for consult: Follow-up assessment;NICU baby  NICU baby 732 weeks old. Mom reports that she and baby roomed-in last night in preparation for D/C home today. Mom feeding baby formula by bottle with baby cradled in her arm. Baby struggling to suckle and swallow. Demonstrated to mom how to sit baby more upright and pace the feeding. Baby no longer struggling with feeding. Mom reports that she is having difficulty getting baby latched to breast. Discussed mom's milk supply and her pumping. Mom reports that her milk supply is very small at this point and she has not been keeping up with her pumping. However, mom states that she really wants to supply baby with as much breast milk as she can now that the baby will be at home and it may be easier to pump. Discussed importance of pumping during first 2-3 weeks postpartum. Enc mom to put baby to breast first with each feeding, then supplement baby with EBM/formula--giving EBM first. Enc mom to post-pump after each feeding to 15 minutes followed by hand expression. Discussed power-pumping method as well.   Mom had questions about extending her pump rental. Enc mom to contact her private insurance company again, and then call [facility] about a monthly rental if needed. Mom aware of OP/BFSG and LC phone line assistance after D/C.   Enc mom to "wear" baby as much as she is able and discussed rationale and methods of this practice.    Maternal Data    Feeding    LATCH Score/Interventions                      Lactation Tools Discussed/Used     Consult Status Consult Status: PRN    [name], Deyja Sochacki 04/12/2015, 10:20 AM

## 2017-06-24 IMAGING — US US MFM OB LIMITED
1 series · 15 of 28 positions shown · non-contrast
Comparison: none

[Series 1: us mfm ob limited · 36 acquisitions, 15 frames shown]
[im 1/36]
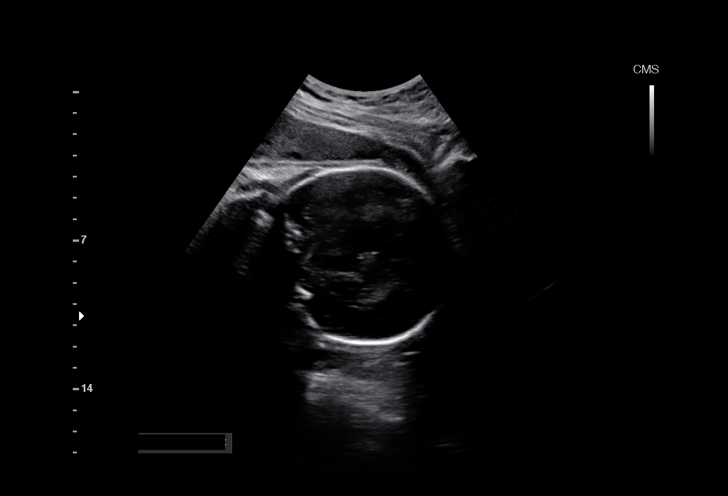
[im 3/36]
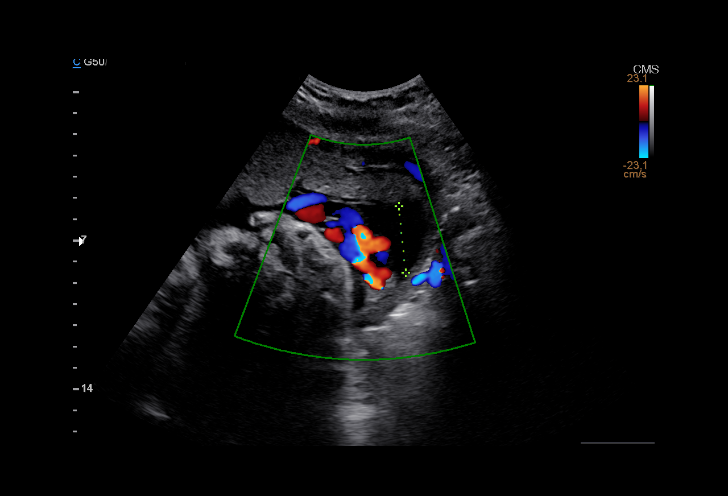
[im 6/36]
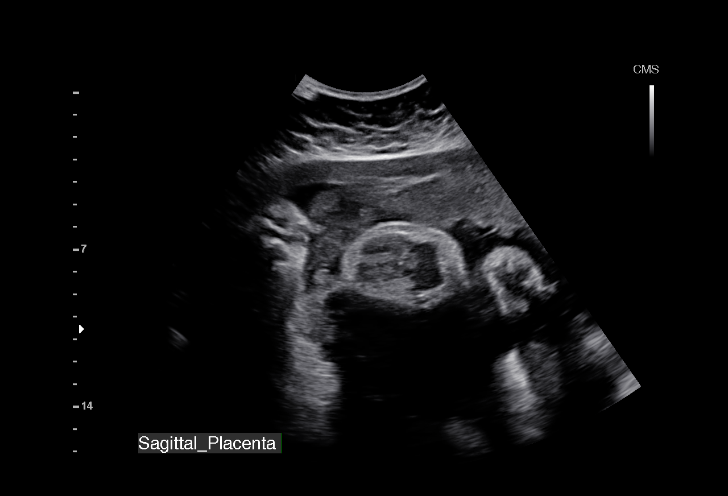
[im 8/36]
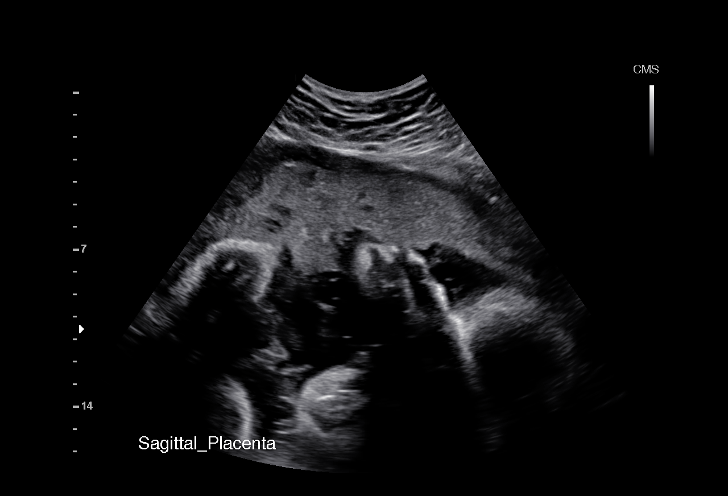
[im 11/36]
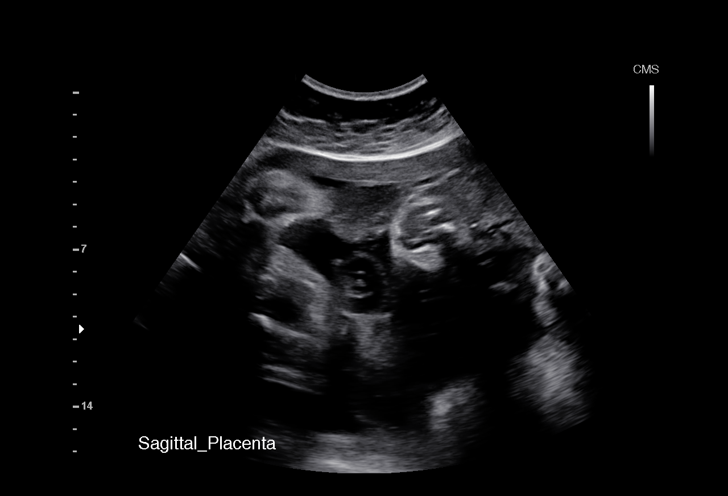
[im 13/36]
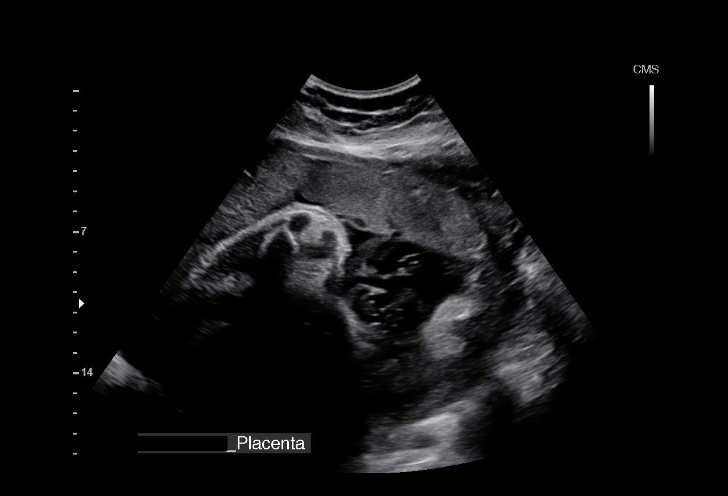
[im 16/36]
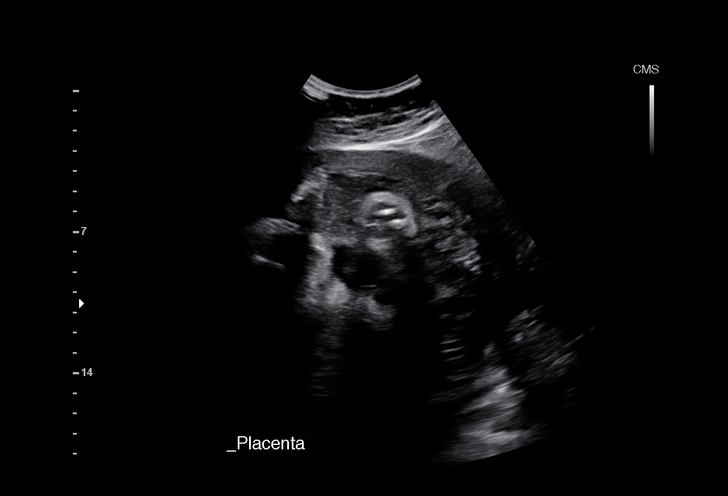
[im 19/36]
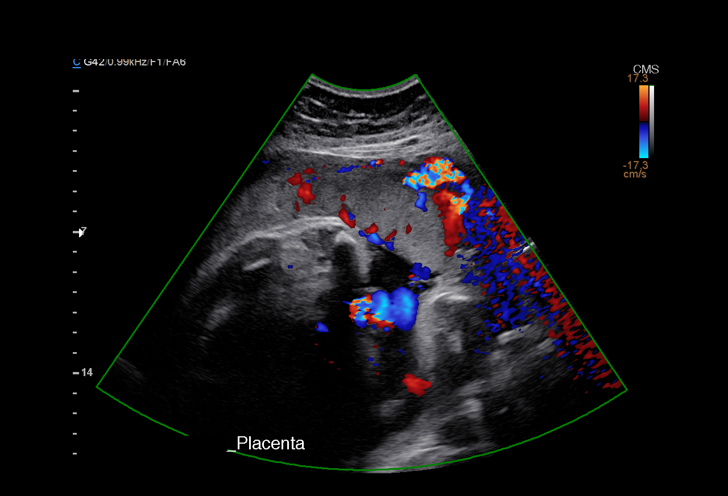
[im 20/36]
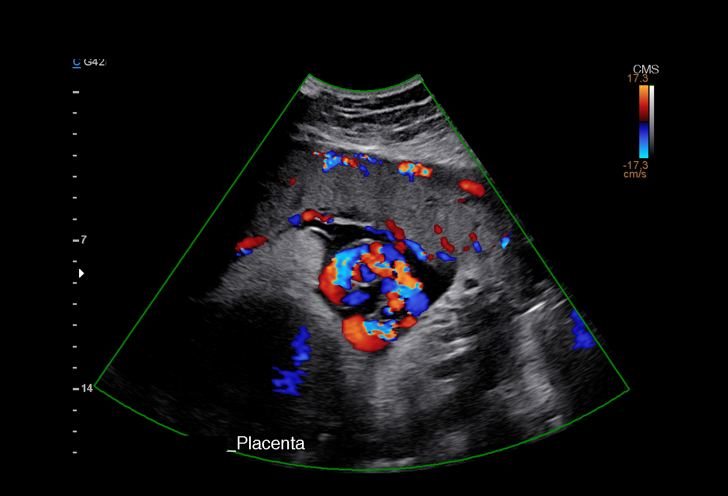
[im 23/36]
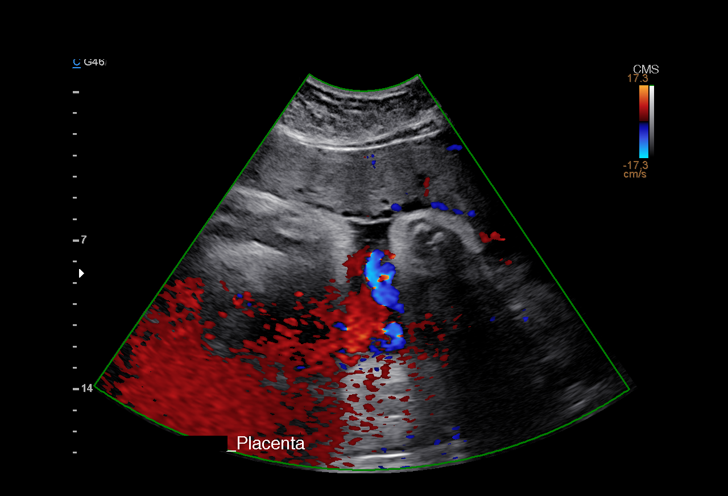
[im 25/36]
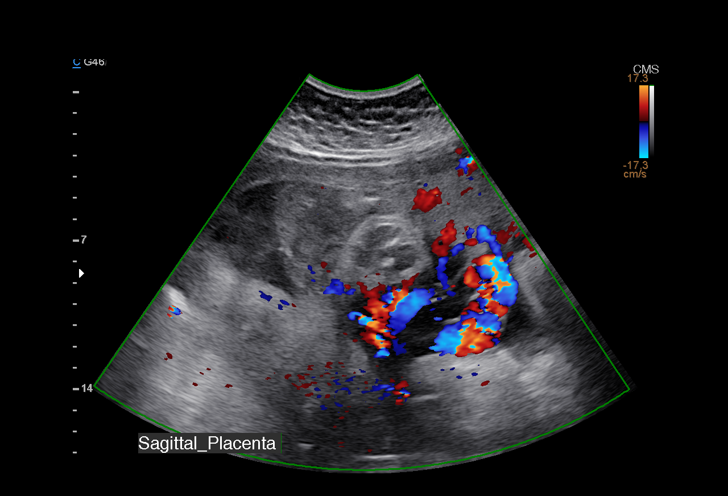
[im 28/36]
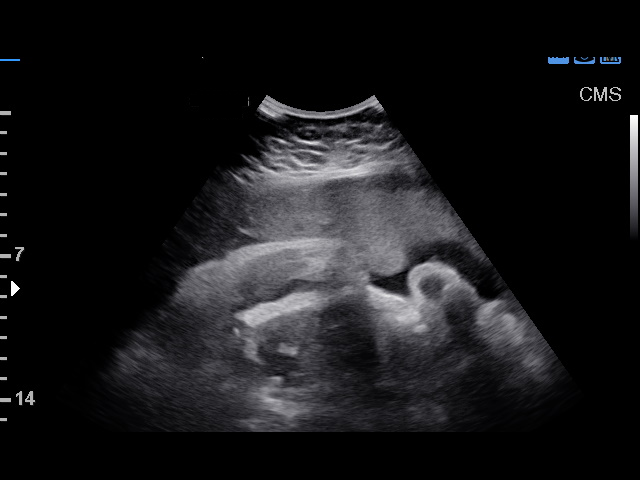
[im 30/36]
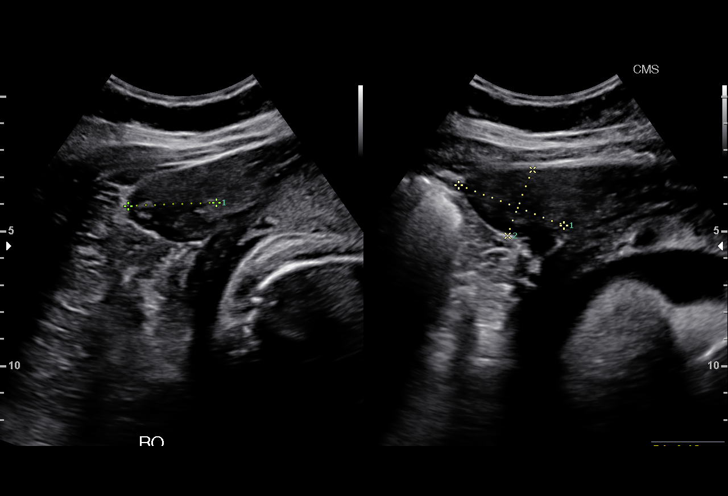
[im 33/36]
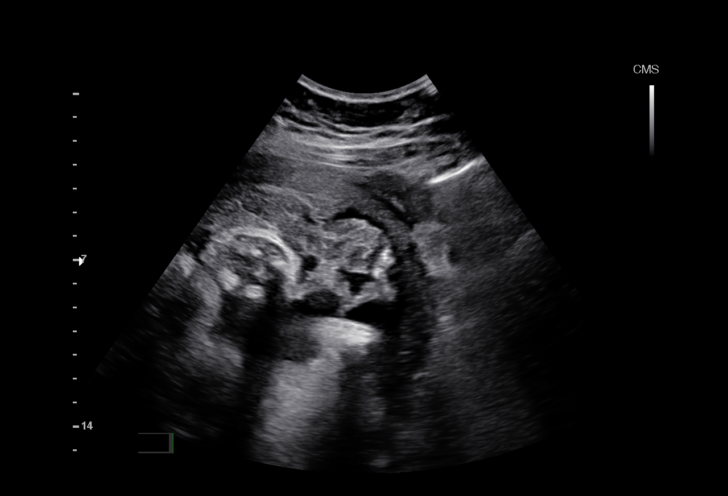
[im 36/36]
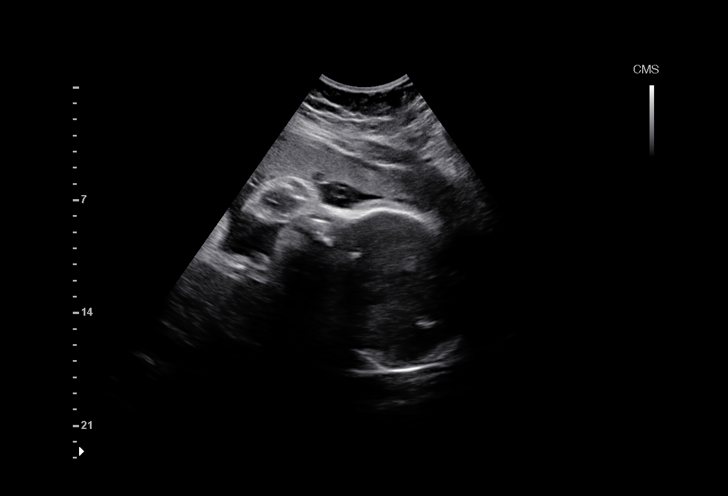

[15 of 28 positions shown; findings below may reference images not displayed]

OBSTETRICS REPORT
(Signed Final 03/28/2015 [DATE])

Service(s) Provided

US MFM OB LIMITED                                     76815.01
Indications

Vaginal bleeding in pregnancy, third trimester
Gestational diabetes in pregnancy, insulin
controlled (on glyburide)
Placenta previa specified as without hemorrhage,
third trimester (resolved)
Previous cesarean delivery, antepartum
Fetal abnormality - other known or suspected
(small head)
34 weeks gestation of pregnancy
Fetal Evaluation

Num Of Fetuses:    1
Fetal Heart Rate:  138                          bpm
Cardiac Activity:  Observed
Presentation:      Cephalic
Placenta:          Anterior, above cervical os
P. Cord            Not well visualized
Insertion:

Amniotic Fluid
AFI FV:      Subjectively within normal limits
AFI Sum:     17.6    cm       65  %Tile     Larg Pckt:    8.68  cm
RUQ:   5.77    cm   LUQ:    3.15   cm    LLQ:   8.68    cm
Gestational Age

LMP:           34w 2d        Date:  07/31/14                 EDD:   05/07/15
Best:          34w 2d     Det. By:  LMP  (07/31/14)          EDD:   05/07/15
Cervix Uterus Adnexa

Cervix:       Not visualized (advanced GA >24wks)
Uterus:       No abnormality visualized.
Cul De Sac:   No free fluid seen.
Left Ovary:    Within normal limits.
Right Ovary:   Within normal limits.
Adnexa:     No abnormality visualized.
Impression

Single IUP at 34w 2d
Limited ultrasound performed due to continued vaginal
bleeding
Anterior placenta - no evidence of placenta previa
What appears to be formed clot is noted along the inferior
and superior edges of the placenta - no obvious subchorionic
fluid collections are appreciated
Normal amniotic fluid volume
Recommendations

Recommend continued inpatient observation due to third
trimester vaginal bleeding

questions or concerns.

## 2017-06-24 IMAGING — US US MFM OB LIMITED
1 series · 15 of 28 positions shown · non-contrast
Comparison: none

[Series 1: us mfm ob limited · 31 acquisitions, 15 frames shown]
[im 1/31]
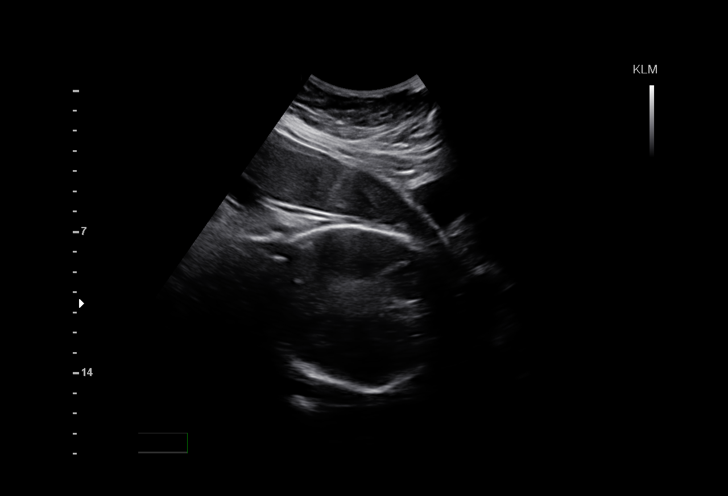
[im 3/31]
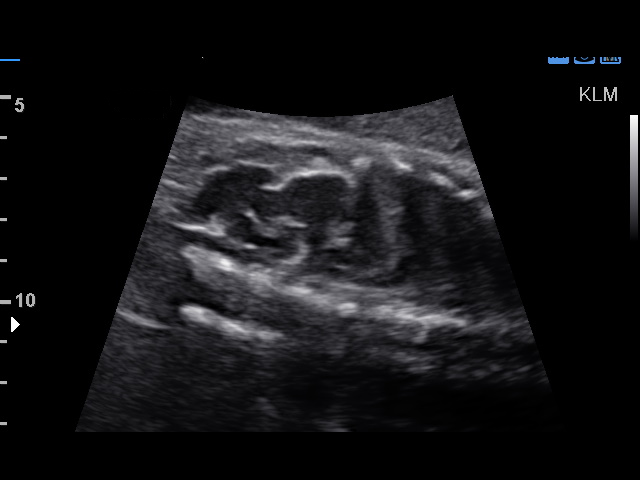
[im 5/31]
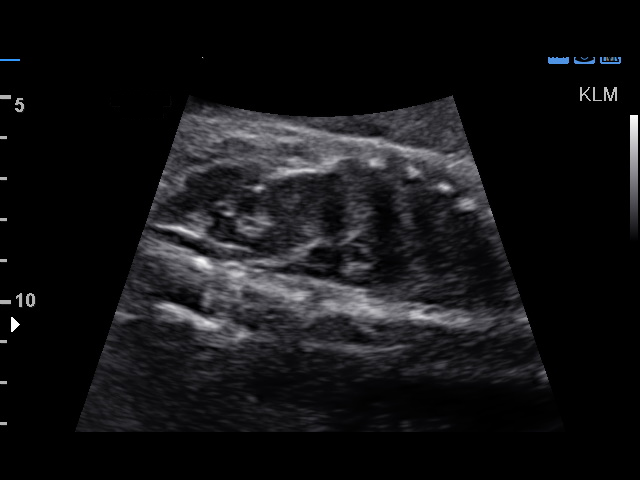
[im 7/31]
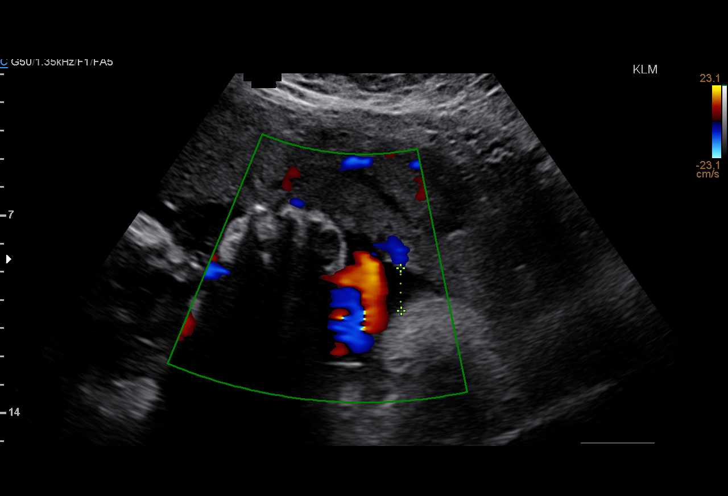
[im 9/31]
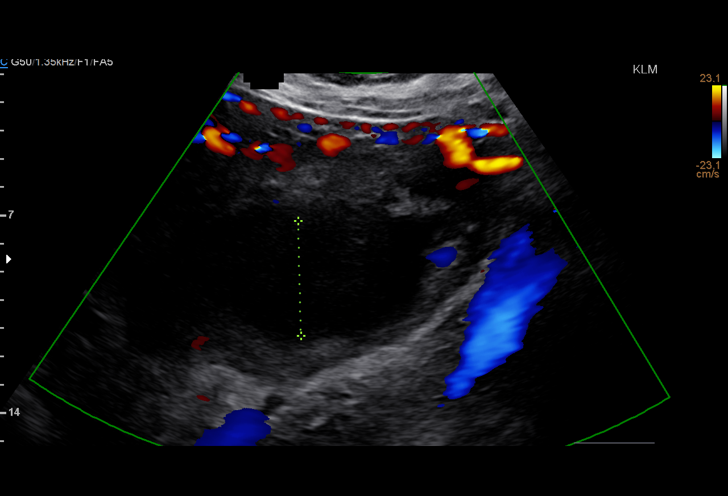
[im 12/31]
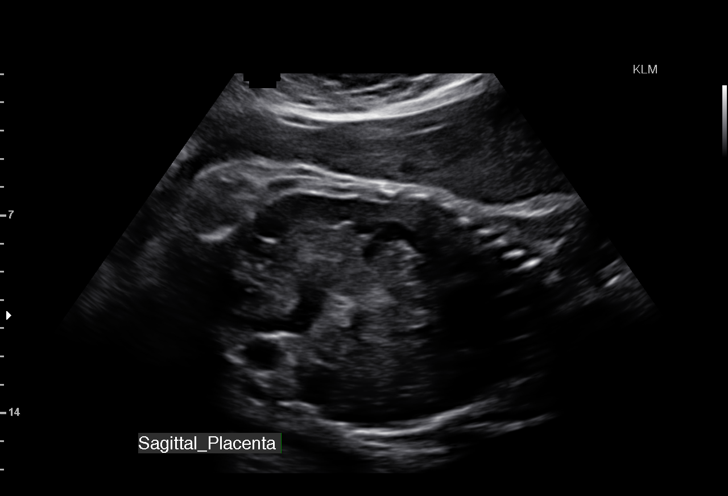
[im 14/31]
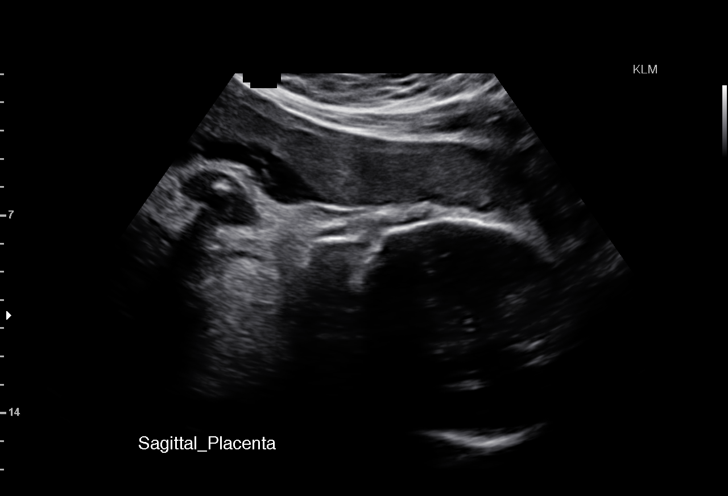
[im 16/31]
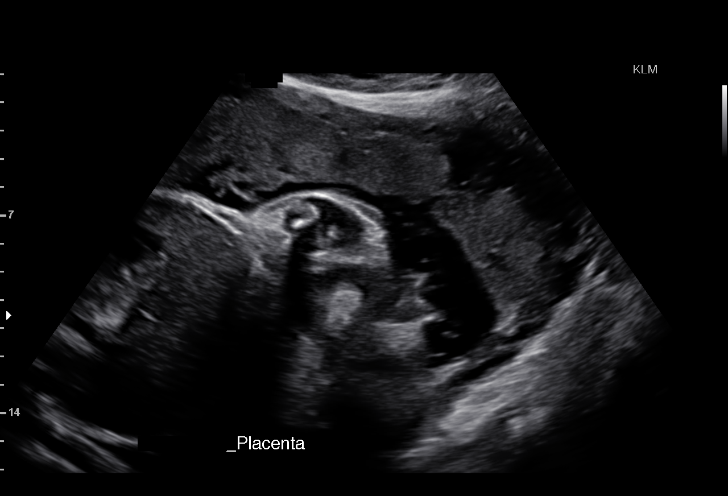
[im 17/31]
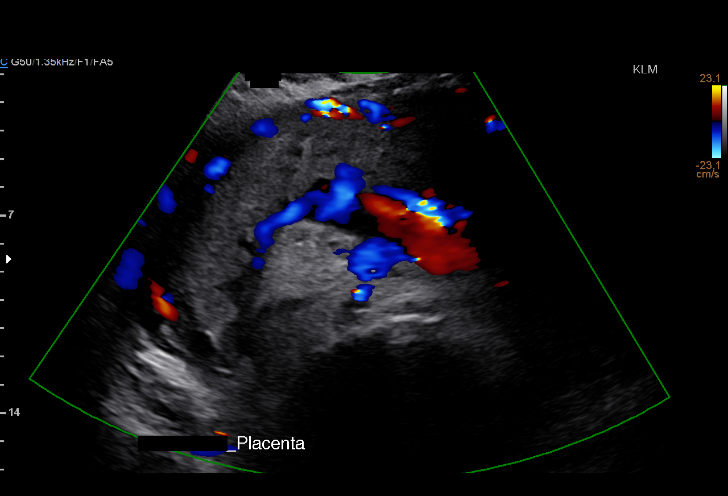
[im 19/31]
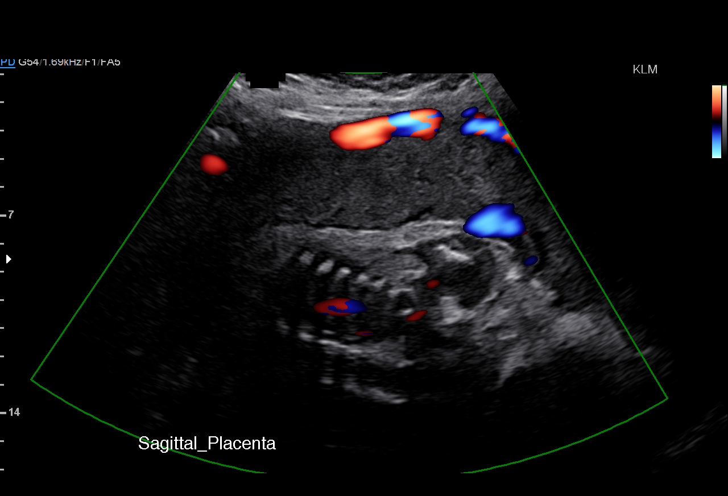
[im 22/31]
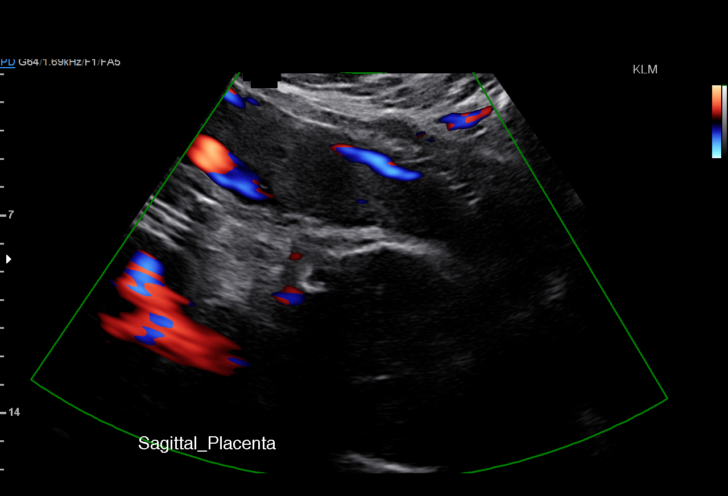
[im 24/31]
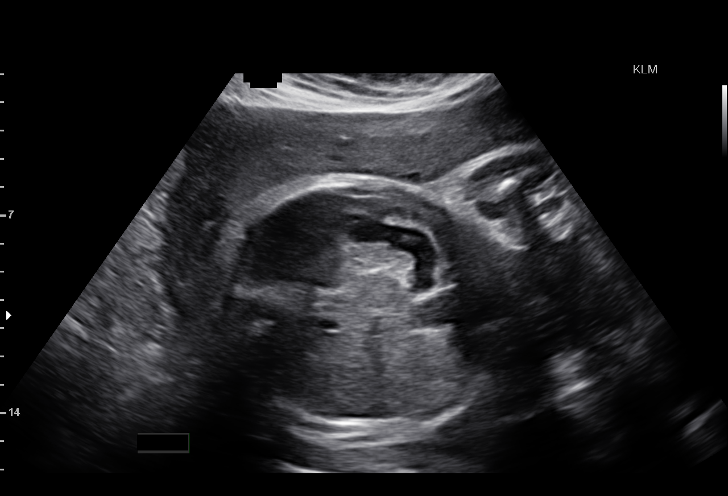
[im 26/31]
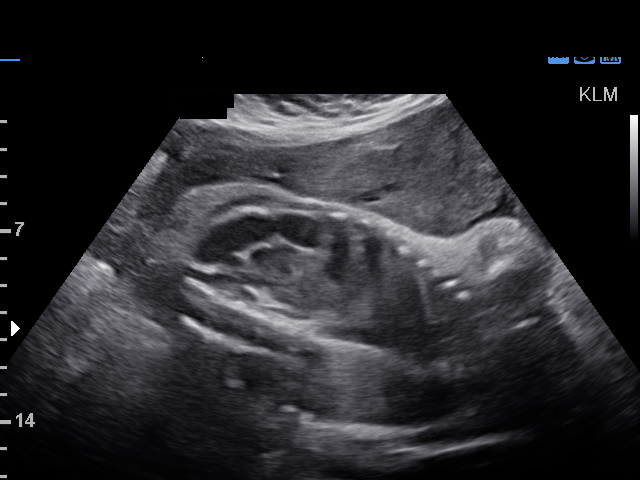
[im 28/31]
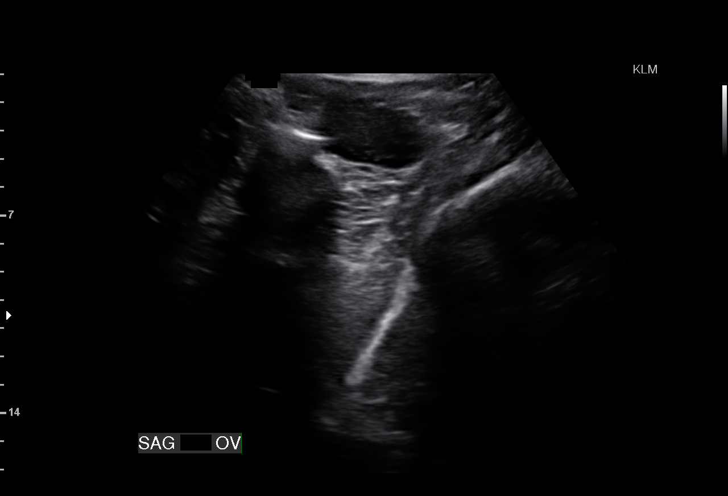
[im 31/31]
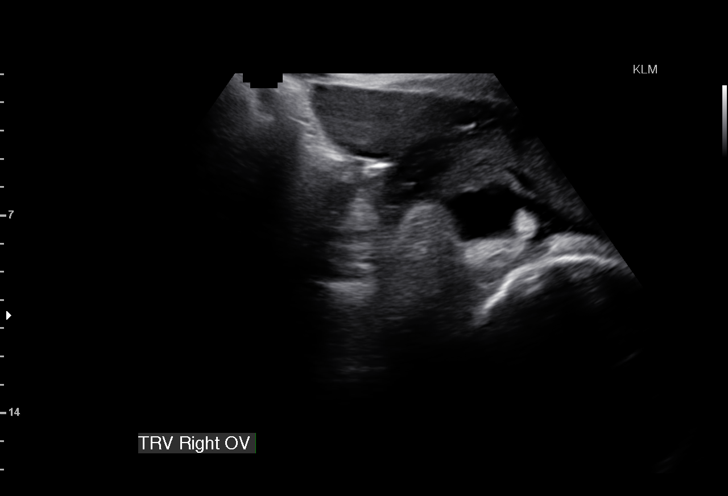

[15 of 28 positions shown; findings below may reference images not displayed]

OBSTETRICS REPORT
(Signed Final 03/28/2015 [DATE])

MAU/Triage
Service(s) Provided

US MFM OB LIMITED                                     76815.01
Indications

Vaginal bleeding in pregnancy, third trimester
Gestational diabetes in pregnancy, insulin
controlled (on glyburide)
Placenta previa specified as without hemorrhage,
third trimester (resolved)
Previous cesarean delivery, antepartum
Fetal abnormality - other known or suspected
(small head)
34 weeks gestation of pregnancy
Fetal Evaluation

Num Of Fetuses:    1
Fetal Heart Rate:  149                          bpm
Cardiac Activity:  Observed
Presentation:      Cephalic
Placenta:          Anterior, above cervical os
P. Cord            Previously Visualized
Insertion:

Comment:    No placental abruption or previa identified.

Amniotic Fluid
AFI FV:      Subjectively within normal limits
AFI Sum:     10.4    cm       22  %Tile     Larg Pckt:    4.06  cm
RUQ:   3.16    cm   RLQ:    4.06   cm    LUQ:   1.51    cm   LLQ:    1.67   cm
Gestational Age

LMP:           34w 2d        Date:  07/31/14                 EDD:   05/07/15
Best:          34w 2d     Det. By:  LMP  (07/31/14)          EDD:   05/07/15
Cervix Uterus Adnexa

Cervix:       Not visualized (advanced GA >32wks)
Left Ovary:    Within normal limits.
Right Ovary:   Within normal limits.

Adnexa:     No abnormality visualized.
Impression

Single IUP at 34w 2d
Limited ultrasound performed due to vaginal bleeding
The previosly noted placenta previa appears to have
resolved.  No subchorionic fluid collections noted.
Fetal arrhythmia is not appreciated on today's study
BPP [DATE]
Normal amniotic fluid volume
Recommendations

Continue antenatal testing as previously scheduled.

questions or concerns.

## 2020-03-03 ENCOUNTER — Other Ambulatory Visit: Payer: Self-pay

## 2020-03-09 ENCOUNTER — Other Ambulatory Visit: Payer: Self-pay | Admitting: Obstetrics and Gynecology

## 2020-03-09 DIAGNOSIS — O09891 Supervision of other high risk pregnancies, first trimester: Secondary | ICD-10-CM

## 2020-03-24 ENCOUNTER — Encounter: Payer: Self-pay | Admitting: *Deleted

## 2020-03-28 ENCOUNTER — Ambulatory Visit: Payer: BC Managed Care – PPO | Admitting: *Deleted

## 2020-03-28 ENCOUNTER — Ambulatory Visit: Payer: BC Managed Care – PPO | Attending: Obstetrics and Gynecology

## 2020-03-28 ENCOUNTER — Ambulatory Visit (HOSPITAL_BASED_OUTPATIENT_CLINIC_OR_DEPARTMENT_OTHER): Payer: BC Managed Care – PPO | Admitting: Obstetrics

## 2020-03-28 ENCOUNTER — Other Ambulatory Visit: Payer: Self-pay

## 2020-03-28 VITALS — BP 122/69 | HR 67

## 2020-03-28 DIAGNOSIS — O034 Incomplete spontaneous abortion without complication: Secondary | ICD-10-CM

## 2020-03-28 DIAGNOSIS — Z3A10 10 weeks gestation of pregnancy: Secondary | ICD-10-CM | POA: Insufficient documentation

## 2020-03-28 DIAGNOSIS — O09891 Supervision of other high risk pregnancies, first trimester: Secondary | ICD-10-CM

## 2020-03-28 DIAGNOSIS — O09529 Supervision of elderly multigravida, unspecified trimester: Secondary | ICD-10-CM | POA: Diagnosis present

## 2020-03-28 NOTE — Progress Notes (Signed)
MFM Consult Note  This patient was seen in consultation and for an ultrasound due to maternal exposure to mycophenolate mofetil (CellCept) during the first trimester of her current pregnancy.  The patient reports that she started taking mycophenolate for treatment of severe eczema last month.  She has been experiencing vaginal bleeding since last week.  Her past OB history includes 2 cesarean deliveries at term.  She denies any other significant past medical or surgical history.  On today's exam, it appears that the patient is in the process of miscarrying the pregnancy.  An empty gestational sac is noted in the lower uterine segment.  A fetal pole was not visualized today.  The patient was advised that mycophenolate mofetil use during the first trimester of pregnancy increases the risk for fetal birth defects such as facial abnormalities including a cleft lip and palate and abnormalities with the external ear.  It may also increase the risk of first trimester miscarriages.  The patient was advised regarding today's ultrasound findings.  Management options presented to the patient includesd Cytotec administration to help with the the passage of the products of conception versus a D&C.  The patient will discuss with you the possibility of having a laparoscopic tubal ligation performed with a D&C.  At the end of the consultation, the patient stated that all of her questions have been answered to her complete satisfaction.    Total time spent in consultation 30 minutes.

## 2020-03-29 ENCOUNTER — Ambulatory Visit (HOSPITAL_COMMUNITY)
Admission: RE | Admit: 2020-03-29 | Discharge: 2020-03-29 | Disposition: A | Discharge status: Home / Self Care | Payer: BC Managed Care – PPO | Source: Ambulatory Visit | Attending: Obstetrics and Gynecology | Admitting: Obstetrics and Gynecology

## 2020-03-29 ENCOUNTER — Other Ambulatory Visit (HOSPITAL_COMMUNITY): Payer: Self-pay | Admitting: Obstetrics and Gynecology

## 2020-03-29 ENCOUNTER — Other Ambulatory Visit: Payer: Self-pay | Admitting: Obstetrics and Gynecology

## 2020-03-29 ENCOUNTER — Ambulatory Visit (HOSPITAL_COMMUNITY): Payer: BC Managed Care – PPO | Admitting: Anesthesiology

## 2020-03-29 ENCOUNTER — Inpatient Hospital Stay (HOSPITAL_COMMUNITY)
Admission: AD | Admit: 2020-03-29 | Discharge: 2020-03-29 | Disposition: A | Discharge status: Home / Self Care | Payer: BC Managed Care – PPO | Source: Home / Self Care | Attending: Obstetrics and Gynecology | Admitting: Obstetrics and Gynecology

## 2020-03-29 ENCOUNTER — Encounter (HOSPITAL_COMMUNITY)
Admission: RE | Disposition: A | Discharge status: Home / Self Care | Payer: Self-pay | Source: Ambulatory Visit | Attending: Obstetrics and Gynecology

## 2020-03-29 ENCOUNTER — Encounter (HOSPITAL_COMMUNITY): Payer: Self-pay | Admitting: Obstetrics and Gynecology

## 2020-03-29 ENCOUNTER — Other Ambulatory Visit: Payer: Self-pay

## 2020-03-29 DIAGNOSIS — O021 Missed abortion: Secondary | ICD-10-CM

## 2020-03-29 DIAGNOSIS — R55 Syncope and collapse: Secondary | ICD-10-CM | POA: Insufficient documentation

## 2020-03-29 DIAGNOSIS — N939 Abnormal uterine and vaginal bleeding, unspecified: Secondary | ICD-10-CM | POA: Insufficient documentation

## 2020-03-29 DIAGNOSIS — Z20822 Contact with and (suspected) exposure to covid-19: Secondary | ICD-10-CM | POA: Insufficient documentation

## 2020-03-29 DIAGNOSIS — Z87891 Personal history of nicotine dependence: Secondary | ICD-10-CM | POA: Diagnosis not present

## 2020-03-29 HISTORY — PX: OPERATIVE ULTRASOUND: SHX5996

## 2020-03-29 HISTORY — PX: DILATION AND EVACUATION: SHX1459

## 2020-03-29 LAB — GLUCOSE, CAPILLARY: Glucose-Capillary: 85 mg/dL (ref 70–99)

## 2020-03-29 LAB — BASIC METABOLIC PANEL
Anion gap: 12 (ref 5–15)
BUN: 6 mg/dL (ref 6–20)
CO2: 22 mmol/L (ref 22–32)
Calcium: 8.7 mg/dL — ABNORMAL LOW (ref 8.9–10.3)
Chloride: 104 mmol/L (ref 98–111)
Creatinine, Ser: 0.62 mg/dL (ref 0.44–1.00)
GFR, Estimated: >60 mL/min (ref >60)
Glucose, Bld: 85 mg/dL (ref 70–99)
Potassium: 3.2 mmol/L — ABNORMAL LOW (ref 3.5–5.1)
Sodium: 138 mmol/L (ref 135–145)

## 2020-03-29 LAB — CBC
HCT: 30 % — ABNORMAL LOW (ref 36.0–46.0)
HCT: 30.4 % — ABNORMAL LOW (ref 36.0–46.0)
Hemoglobin: 9.8 g/dL — ABNORMAL LOW (ref 12.0–15.0)
Hemoglobin: 9.4 g/dL — ABNORMAL LOW (ref 12.0–15.0)
MCH: 26.3 pg (ref 26.0–34.0)
MCH: 25.5 pg — ABNORMAL LOW (ref 26.0–34.0)
MCHC: 32.7 g/dL (ref 30.0–36.0)
MCHC: 30.9 g/dL (ref 30.0–36.0)
MCV: 80.4 fL (ref 80.0–100.0)
MCV: 82.6 fL (ref 80.0–100.0)
Platelets: 210 10*3/uL (ref 150–400)
Platelets: 201 10*3/uL (ref 150–400)
RBC: 3.73 MIL/uL — ABNORMAL LOW (ref 3.87–5.11)
RBC: 3.68 MIL/uL — ABNORMAL LOW (ref 3.87–5.11)
RDW: 16.3 % — ABNORMAL HIGH (ref 11.5–15.5)
RDW: 16.5 % — ABNORMAL HIGH (ref 11.5–15.5)
WBC: 5.7 10*3/uL (ref 4.0–10.5)
WBC: 6.8 10*3/uL (ref 4.0–10.5)
nRBC: 0 % (ref 0.0–0.2)
nRBC: 0 % (ref 0.0–0.2)

## 2020-03-29 LAB — TYPE AND SCREEN
ABO/RH(D): O POS
ABO/RH(D): O POS
Antibody Screen: NEGATIVE
Antibody Screen: NEGATIVE

## 2020-03-29 LAB — RESPIRATORY PANEL BY RT PCR (FLU A&B, COVID)
Influenza A by PCR: NEGATIVE
Influenza B by PCR: NEGATIVE
SARS Coronavirus 2 by RT PCR: NEGATIVE

## 2020-03-29 SURGERY — DILATION AND EVACUATION, UTERUS
Anesthesia: General | Site: Vagina

## 2020-03-29 MED ORDER — CHLOROPROCAINE HCL 1 % IJ SOLN
INTRAMUSCULAR | Status: DC | PRN
  Administered 2020-03-29: .001 mL

## 2020-03-29 MED ORDER — MIDAZOLAM HCL 2 MG/2ML IJ SOLN
INTRAMUSCULAR | Status: AC
  Filled 2020-03-29: qty 2

## 2020-03-29 MED ORDER — FENTANYL CITRATE (PF) 100 MCG/2ML IJ SOLN: 50.0000 ug | Freq: Once | INTRAMUSCULAR | Status: AC

## 2020-03-29 MED ORDER — FENTANYL CITRATE (PF) 100 MCG/2ML IJ SOLN: 25.0000 ug | INTRAMUSCULAR | Status: DC | PRN

## 2020-03-29 MED ORDER — DEXAMETHASONE SODIUM PHOSPHATE 4 MG/ML IJ SOLN
INTRAMUSCULAR | Status: DC | PRN
  Administered 2020-03-29: 5 mg via INTRAVENOUS

## 2020-03-29 MED ORDER — MIDAZOLAM HCL 5 MG/5ML IJ SOLN
INTRAMUSCULAR | Status: DC | PRN
  Administered 2020-03-29: 2 mg via INTRAVENOUS

## 2020-03-29 MED ORDER — GLYCOPYRROLATE 0.2 MG/ML IJ SOLN
INTRAMUSCULAR | Status: DC | PRN
  Administered 2020-03-29: .2 mg via INTRAVENOUS

## 2020-03-29 MED ORDER — SOD CITRATE-CITRIC ACID 500-334 MG/5ML PO SOLN
30.0000 mL | ORAL | Status: AC
  Administered 2020-03-29: 30 mL via ORAL
  Filled 2020-03-29: qty 15

## 2020-03-29 MED ORDER — LACTATED RINGERS IV SOLN: INTRAVENOUS | Status: DC

## 2020-03-29 MED ORDER — KETOROLAC TROMETHAMINE 15 MG/ML IJ SOLN
15.0000 mg | INTRAMUSCULAR | Status: AC
  Administered 2020-03-29: 15 mg via INTRAVENOUS
  Filled 2020-03-29: qty 1

## 2020-03-29 MED ORDER — OXYCODONE HCL 5 MG/5ML PO SOLN: 5.0000 mg | Freq: Once | ORAL | Status: DC | PRN

## 2020-03-29 MED ORDER — FENTANYL CITRATE (PF) 250 MCG/5ML IJ SOLN
INTRAMUSCULAR | Status: AC
  Filled 2020-03-29: qty 5

## 2020-03-29 MED ORDER — POTASSIUM CHLORIDE CRYS ER 20 MEQ PO TBCR: 40.0000 meq | EXTENDED_RELEASE_TABLET | Freq: Two times a day (BID) | ORAL | 0 refills | Status: AC

## 2020-03-29 MED ORDER — 0.9 % SODIUM CHLORIDE (POUR BTL) OPTIME
TOPICAL | Status: DC | PRN
  Administered 2020-03-29: 1000 mL

## 2020-03-29 MED ORDER — MEDROXYPROGESTERONE ACETATE 150 MG/ML IM SUSP: 150.0000 mg | Freq: Once | INTRAMUSCULAR | 0 refills | Status: AC

## 2020-03-29 MED ORDER — IBUPROFEN 800 MG PO TABS: 800.0000 mg | ORAL_TABLET | Freq: Three times a day (TID) | ORAL | 7 refills | Status: AC | PRN

## 2020-03-29 MED ORDER — KETOROLAC TROMETHAMINE 15 MG/ML IJ SOLN
INTRAMUSCULAR | Status: DC | PRN
  Administered 2020-03-29: 30 mg via INTRAVENOUS

## 2020-03-29 MED ORDER — CHLOROPROCAINE HCL 1 % IJ SOLN
INTRAMUSCULAR | Status: AC
  Filled 2020-03-29: qty 30

## 2020-03-29 MED ORDER — ONDANSETRON HCL 4 MG/2ML IJ SOLN
INTRAMUSCULAR | Status: DC | PRN
  Administered 2020-03-29: 4 mg via INTRAVENOUS

## 2020-03-29 MED ORDER — POVIDONE-IODINE 10 % EX SWAB: 2.0000 "application " | Freq: Once | CUTANEOUS | Status: DC

## 2020-03-29 MED ORDER — ONDANSETRON HCL 4 MG/2ML IJ SOLN: 4.0000 mg | Freq: Once | INTRAMUSCULAR | Status: DC | PRN

## 2020-03-29 MED ORDER — LIDOCAINE HCL (CARDIAC) PF 100 MG/5ML IV SOSY
PREFILLED_SYRINGE | INTRAVENOUS | Status: DC | PRN
  Administered 2020-03-29: 20 mg via INTRAVENOUS

## 2020-03-29 MED ORDER — FENTANYL CITRATE (PF) 100 MCG/2ML IJ SOLN
INTRAMUSCULAR | Status: DC | PRN
  Administered 2020-03-29: 50 ug via INTRAVENOUS

## 2020-03-29 MED ORDER — HYDROCODONE-ACETAMINOPHEN 5-325 MG PO TABS: 1.0000 | ORAL_TABLET | Freq: Four times a day (QID) | ORAL | 0 refills | Status: AC | PRN

## 2020-03-29 MED ORDER — PROPOFOL 10 MG/ML IV BOLUS
INTRAVENOUS | Status: DC | PRN
  Administered 2020-03-29: 200 mg via INTRAVENOUS

## 2020-03-29 MED ORDER — MEDROXYPROGESTERONE ACETATE 150 MG/ML IM SUSP
150.0000 mg | Freq: Once | INTRAMUSCULAR | Status: AC
  Administered 2020-03-29: 150 mg via INTRAMUSCULAR
  Filled 2020-03-29: qty 1

## 2020-03-29 MED ORDER — LACTATED RINGERS IV SOLN: INTRAVENOUS | Status: DC | PRN

## 2020-03-29 MED ORDER — FENTANYL CITRATE (PF) 100 MCG/2ML IJ SOLN
INTRAMUSCULAR | Status: AC
  Administered 2020-03-29: 50 ug via INTRAVENOUS
  Filled 2020-03-29: qty 2

## 2020-03-29 MED ORDER — OXYCODONE HCL 5 MG PO TABS: 5.0000 mg | ORAL_TABLET | Freq: Once | ORAL | Status: DC | PRN

## 2020-03-29 MED ORDER — PROPOFOL 10 MG/ML IV BOLUS
INTRAVENOUS | Status: AC
  Filled 2020-03-29: qty 20

## 2020-03-29 MED ORDER — SUCCINYLCHOLINE CHLORIDE 20 MG/ML IJ SOLN
INTRAMUSCULAR | Status: DC | PRN
  Administered 2020-03-29: 120 mg via INTRAVENOUS

## 2020-03-29 MED ORDER — CHLORHEXIDINE GLUCONATE 0.12 % MT SOLN
15.0000 mL | Freq: Once | OROMUCOSAL | Status: AC
  Administered 2020-03-29: 15 mL via OROMUCOSAL
  Filled 2020-03-29: qty 15

## 2020-03-29 MED ORDER — SODIUM CHLORIDE 0.9 % IV SOLN
100.0000 mg | Freq: Two times a day (BID) | INTRAVENOUS | Status: DC
  Administered 2020-03-29: 100 mg via INTRAVENOUS
  Filled 2020-03-29: qty 100

## 2020-03-29 SURGICAL SUPPLY — 21 items
CATH ROBINSON RED A/P 16FR (CATHETERS) ×4 IMPLANT
DECANTER SPIKE VIAL GLASS SM (MISCELLANEOUS) ×4 IMPLANT
FILTER UTR ASPR ASSEMBLY (MISCELLANEOUS) ×4 IMPLANT
GLOVE BIOGEL PI IND STRL 7.0 (GLOVE) ×4 IMPLANT
GLOVE BIOGEL PI INDICATOR 7.0 (GLOVE) ×4
GLOVE ECLIPSE 6.5 STRL STRAW (GLOVE) ×4 IMPLANT
GOWN STRL REUS W/ TWL LRG LVL3 (GOWN DISPOSABLE) ×4 IMPLANT
GOWN STRL REUS W/TWL LRG LVL3 (GOWN DISPOSABLE) ×4
HOSE CONNECTING 18IN BERKELEY (TUBING) ×4 IMPLANT
KIT BERKELEY 1ST TRI 3/8 NO TR (MISCELLANEOUS) ×4 IMPLANT
KIT BERKELEY 1ST TRIMESTER 3/8 (MISCELLANEOUS) ×4 IMPLANT
NS IRRIG 1000ML POUR BTL (IV SOLUTION) ×4 IMPLANT
PACK VAGINAL MINOR WOMEN LF (CUSTOM PROCEDURE TRAY) ×4 IMPLANT
PAD OB MATERNITY 4.3X12.25 (PERSONAL CARE ITEMS) ×4 IMPLANT
SET BERKELEY SUCTION TUBING (SUCTIONS) ×4 IMPLANT
TOWEL GREEN STERILE FF (TOWEL DISPOSABLE) ×8 IMPLANT
UNDERPAD 30X36 HEAVY ABSORB (UNDERPADS AND DIAPERS) ×4 IMPLANT
VACURETTE 10 RIGID CVD (CANNULA) IMPLANT
VACURETTE 7MM CVD STRL WRAP (CANNULA) IMPLANT
VACURETTE 8 RIGID CVD (CANNULA) ×4 IMPLANT
VACURETTE 9 RIGID CVD (CANNULA) IMPLANT

## 2020-03-29 NOTE — Anesthesia Preprocedure Evaluation (Signed)
Anesthesia Evaluation  Patient identified by MRN, date of birth, ID band Patient awake    Reviewed: Allergy & Precautions, NPO status , Patient's Chart, lab work & pertinent test results  Airway Mallampati: II  TM Distance: >3 FB Neck ROM: Full    Dental  (+) Teeth Intact, Dental Advisory Given   Pulmonary former smoker,    breath sounds clear to auscultation       Cardiovascular  Rhythm:Regular Rate:Normal     Neuro/Psych    GI/Hepatic   Endo/Other  diabetes  Renal/GU      Musculoskeletal   Abdominal   Peds  Hematology   Anesthesia Other Findings   Reproductive/Obstetrics                             Anesthesia Physical Anesthesia Plan  ASA: II  Anesthesia Plan: General   Post-op Pain Management:    Induction: Intravenous, Cricoid pressure planned and Rapid sequence  PONV Risk Score and Plan: Ondansetron and Dexamethasone  Airway Management Planned: Oral ETT  Additional Equipment:   Intra-op Plan:   Post-operative Plan: Extubation in OR  Informed Consent: I have reviewed the patients History and Physical, chart, labs and discussed the procedure including the risks, benefits and alternatives for the proposed anesthesia with the patient or authorized representative who has indicated his/her understanding and acceptance.     Dental advisory given  Plan Discussed with: CRNA and Anesthesiologist  Anesthesia Plan Comments:         Anesthesia Quick Evaluation

## 2020-03-29 NOTE — Anesthesia Postprocedure Evaluation (Signed)
Anesthesia Post Note  Patient: Tamara Boyle  Procedure(s) Performed: DILATATION AND EVACUATION (N/A Vagina ) GUIDED ULTRASOUND (N/A Abdomen)     Patient location during evaluation: PACU Anesthesia Type: General Level of consciousness: awake and alert, patient cooperative and oriented Pain management: pain level controlled Vital Signs Assessment: post-procedure vital signs reviewed and stable Respiratory status: spontaneous breathing, nonlabored ventilation and respiratory function stable Cardiovascular status: blood pressure returned to baseline and stable Postop Assessment: no apparent nausea or vomiting and adequate PO intake Anesthetic complications: no   No complications documented.  Last Vitals:  Vitals:   03/29/20 1900 03/29/20 1915  BP: 118/75 118/75  Pulse: 88 85  Resp: (!) 21 (!) 22  Temp:  37.1 C  SpO2: 100% 100%    Last Pain:  Vitals:   03/29/20 1900  TempSrc:   PainSc: 0-No pain                 Nikcole Eischeid,E. Sahaj Bona

## 2020-03-29 NOTE — Anesthesia Procedure Notes (Signed)
Procedure Name: Intubation Date/Time: 03/29/2020 6:05 PM Performed by: Tillman Abide, CRNA Pre-anesthesia Checklist: Patient identified, Emergency Drugs available, Suction available and Patient being monitored Patient Re-evaluated:Patient Re-evaluated prior to induction Oxygen Delivery Method: Circle System Utilized Preoxygenation: Pre-oxygenation with 100% oxygen Induction Type: IV induction, Rapid sequence and Cricoid Pressure applied Laryngoscope Size: Miller and 2 Grade View: Grade I Tube type: Oral Tube size: 7.0 mm Number of attempts: 1 Airway Equipment and Method: Stylet Placement Confirmation: ETT inserted through vocal cords under direct vision,  positive ETCO2 and breath sounds checked- equal and bilateral Secured at: 21 cm Tube secured with: Tape Dental Injury: Teeth and Oropharynx as per pre-operative assessment

## 2020-03-29 NOTE — Brief Op Note (Signed)
03/29/2020  6:44 PM  PATIENT:  Tamara Boyle  34 y.o. female  PRE-OPERATIVE DIAGNOSIS:  missed abortion, vagina bleeding  POST-OPERATIVE DIAGNOSIS:  missed abortion, vaginal bleeding  PROCEDURE:  Ultrasound guided suction dilation and evacuation  SURGEON:  Surgeon(s) and Role:    * Maxie Better, MD - Primary  PHYSICIAN ASSISTANT:   ASSISTANTS: none   ANESTHESIA:   general  EBL:  32ml  BLOOD ADMINISTERED:none  DRAINS: none   LOCAL MEDICATIONS USED:  NONE  SPECIMEN:  POC  DISPOSITION OF SPECIMEN:  PATHOLOGY  COUNTS:  YES  TOURNIQUET:  * No tourniquets in log *  DICTATION: .Other Dictation: Dictation Number [id number]  PLAN OF CARE: Discharge to home after PACU  PATIENT DISPOSITION:  PACU - hemodynamically stable.   Delay start of Pharmacological VTE agent (>24hrs) due to surgical blood loss or risk of bleeding: no

## 2020-03-29 NOTE — H&P (Addendum)
Tamara RungMonique Lillia AbedLindsay is an 34 y.o. [phone] BF presents for surgical mgmt of missed abortion noted on sonogram yesterday. Pt has been having vaginal bleeding and cramping  Pertinent Gynecological History: Menses: reg Bleeding: current Contraception: none DES exposure: denies Blood transfusions: none Sexually transmitted diseases: none Previous gyn/ob; C/S x 2 Last mammo n/a Last pap: yrs OB History: [id number]   Menstrual History: Menarche age: n/a Patient's last menstrual period was 02/14/2020.    Past Medical History:  Diagnosis Date  . Anemia   . Gestational diabetes   . Gestational diabetes mellitus, antepartum   . Vaginal discharge during pregnancy in third trimester 03/27/2015    Past Surgical History:  Procedure Laterality Date  . CESAREAN SECTION    . CESAREAN SECTION N/A 03/28/2015   Procedure: CESAREAN SECTION;  Surgeon: Tamara BetterSheronette Marvelle Caudill, MD;  Location: [facility];  Service: Obstetrics;  Laterality: N/A;  . none      Family History  Problem Relation Age of Onset  . Diabetes Other   . Stroke Other   . Obesity Other   . Heart disease Other     Social History:  reports that she quit smoking about 5 years ago. She has quit using smokeless tobacco. She reports that she does not drink alcohol and does not use drugs.  Allergies: No Known Allergies  No medications prior to admission.    Review of Systems  Constitutional: Positive for fatigue.  Endocrine: Negative for polydipsia.  Genitourinary: Positive for vaginal bleeding.  All other systems reviewed and are negative.   Last menstrual period 02/14/2020, unknown if currently breastfeeding. Physical Exam Constitutional:      Appearance: Normal appearance. She is obese.  Eyes:     Extraocular Movements: Extraocular movements intact.  Cardiovascular:     Rate and Rhythm: Regular rhythm.     Heart sounds: Normal heart sounds.  Abdominal:     Palpations: Abdomen is soft.     Comments: pfannensteil skin incision   Genitourinary:    General: Normal vulva.     Comments: Vagina; (+) blood  cervix closed Uterus 8 wk size  Adnexa no palp mass Neurological:     Mental Status: She is alert and oriented to person, place, and time.     Results for orders placed or performed during the hospital encounter of 03/29/20 (from the past 24 hour(s))  CBC     Status: Abnormal   Collection Time: 03/29/20  2:29 AM  Result Value Ref Range   WBC 5.7 4.0 - 10.5 K/uL   RBC 3.73 (L) 3.87 - 5.11 MIL/uL   Hemoglobin 9.8 (L) 12.0 - 15.0 g/dL   HCT 19.130.0 (L) 36 - 46 %   MCV 80.4 80.0 - 100.0 fL   MCH 26.3 26.0 - 34.0 pg   MCHC 32.7 30.0 - 36.0 g/dL   RDW 47.816.3 (H) 29.511.5 - 62.115.5 %   Platelets 210 150 - 400 K/uL   nRBC 0.0 0.0 - 0.2 %  Type and screen [facility]     Status: None   Collection Time: 03/29/20  2:29 AM  Result Value Ref Range   ABO/RH(D) O POS    Antibody Screen NEG    Sample Expiration      04/01/2020,2359 Performed at [facility], [address] [place], [place] [address]   Respiratory Panel by RT PCR (Flu A&B, Covid) - Nasopharyngeal Swab     Status: None   Collection Time: 03/29/20  2:30 AM   Specimen:  Nasopharyngeal Swab  Result Value Ref Range   SARS Coronavirus 2 by RT PCR NEGATIVE NEGATIVE   Influenza A by PCR NEGATIVE NEGATIVE   Influenza B by PCR NEGATIVE NEGATIVE    [place] MFM OB COMPLETE LESS THAN 14 WEEKS  Result Date: 03/28/2020 ----------------------------------------------------------------------  OBSTETRICS REPORT                       (Signed Final 03/28/2020 03:30 pm) ---------------------------------------------------------------------- Patient Info  ID #:       [id number]                          D.O.B.:  1986/05/16 (34 yrs)  Name:       Tamara Boyle                 Visit Date: 03/28/2020 09:17 am ---------------------------------------------------------------------- Performed By  Attending:        Ma Rings MD         Ref. Address:     [facility]s                                                              OB/GYN                                                             [address]                                                             [place]                                                             [address]  Performed By:     Birdena Crandall        [facility]  Referred By:      Nena Jordan                    Geovannie Vilar MD ---------------------------------------------------------------------- Orders  #  Description                           Code        Ordered By  1  [place] MFM OB COMP LESS THAN              76801.4     Dejuana Weist     14 WEEKS                                          Janiya Millirons ----------------------------------------------------------------------  #  Order #                     Accession #                Episode #  1  [id number]                   [id number]                 [id number] ---------------------------------------------------------------------- Indications  Medication exposure during first trimester of  O09.891  pregnancy (mycophenolate  Weeks of gestation of pregnancy not            Z3A.00  specified  Advanced maternal age multigravida 2+,        O93.521  first trimester  Anemia during pregnancy in first trimester     O56.011  Encounter for uncertain dates                  Z36.87  Vaginal bleeding in pregnancy, first trimester O46.91 ---------------------------------------------------------------------- Fetal Evaluation  Num Of Fetuses:         1  Gest. Sac:              Lower segment, deformed  Yolk Sac:               Not visualized  Fetal Pole:             Not visualized  Cardiac Activity:       No embryo visualized   Comment:    Moderate subchorionic hemorrhage noted. ---------------------------------------------------------------------- Biometry  GS:       19.6  mm     G. Age:  7w 0d                   EDD:   11/14/20 ---------------------------------------------------------------------- OB History  Gravidity:    3         Term:   1        Prem:   1  Living:       1 ---------------------------------------------------------------------- Cervix Uterus Adnexa  Cervix  Normal appearance by transabdominal scan.  Uterus  No abnormality visualized.  Right Ovary  Size(cm)       3.8  x   2.7    x  2.4       Vol(ml): 12.89  Within normal limits.  Left Ovary  Size(cm)       3.4  x   2.6    x  2.4       Vol(ml): 11.11  Within normal limits.  Cul De Sac  No free fluid seen.  Adnexa  No adnexal mass visualized. ---------------------------------------------------------------------- Comments  This patient was seen in consultation and for an ultrasound  due to maternal exposure to mycophenolate mofetil  (CellCept) during the first trimester of her current pregnancy.  The patient reports that she started taking mycophenolate for  treatment of severe eczema last month.  She has been  experiencing vaginal bleeding since last week.  Her past OB history includes 2 cesarean deliveries at term.  She denies any other significant past medical or surgical  history.  On today's exam, it appears that the patient is in the process  of miscarrying the pregnancy.  An empty gestational sac is  noted in the lower uterine segment.  A fetal pole was not  visualized today.  The patient was advised that mycophenolate mofetil use  during the first trimester of pregnancy increases the risk for  fetal birth defects such as facial abnormalities including a  cleft lip and palate and abnormalities with the external ear.  It  may also increase the risk of first trimester miscarriages.  The patient was advised regarding today's ultrasound  findings.  Management options  presented to the patient  includesd Cytotec administration to help with the the passage  of the products of conception versus a D&C.  The patient will discuss with you the possibility of having a  laparoscopic tubal ligation performed with a D&C.  At the end of the consultation, the patient stated that all of her  questions have been answered to her complete satisfaction.  Total time spent in consultation 30 minutes. ----------------------------------------------------------------------                   Ma Rings, MD Electronically Signed Final Report   03/28/2020 03:30 pm ----------------------------------------------------------------------   Assessment/Plan: Missed abortion P) u/s guided suction Dilation and evacuation Risk of surgery includes infection, bleeding , injury to surrounding organ structures, uterine perforation ( 06/998), poss need for blood trans fusion and its risk , poss  retained tissue, internal scar tissue. All / answered  Winna Golla A Nasier Thumm 03/29/2020, 12:46 PM   Addendum: I have reexamined pt. No change since last visit,

## 2020-03-29 NOTE — Transfer of Care (Signed)
Immediate Anesthesia Transfer of Care Note  Patient: Tamara Boyle  Procedure(s) Performed: DILATATION AND EVACUATION (N/A Vagina ) GUIDED ULTRASOUND (N/A Abdomen)  Patient Location: PACU  Anesthesia Type:General  Level of Consciousness: awake, alert , oriented and patient cooperative  Airway & Oxygen Therapy: Patient Spontanous Breathing  Post-op Assessment: Report given to RN and Post -op Vital signs reviewed and stable  Post vital signs: Reviewed and stable  Last Vitals:  Vitals Value Taken Time  BP 119/68 03/29/20 1840  Temp    Pulse 93 03/29/20 1840  Resp 14 03/29/20 1840  SpO2 100 % 03/29/20 1840  Vitals shown include unvalidated device data.  Last Pain:  Vitals:   03/29/20 1715  TempSrc:   PainSc: 10-Worst pain ever      Patients Stated Pain Goal: 3 (03/29/20 1715)  Complications: No complications documented.

## 2020-03-29 NOTE — MAU Note (Signed)
Pt reports she has been bleeding since last week, , u/s today showed she is miscarrying , tonight she started bleeding heavier, and is having more pain. States she ?passed out at home tonight. Right now pain has eased some after 1000 mg of ibuprofen

## 2020-03-29 NOTE — MAU Provider Note (Signed)
History     CSN: [id number]  Arrival date and time: 03/29/20 9326   First Provider Initiated Contact with Patient 03/29/20 0218      Chief Complaint  Patient presents with   Vaginal Bleeding   HPI 34 yo G4 P2 MF with dx MAB presents with c/o passing out three times as well as having had pain for which she took ibuprofen before she came to the hospital. Pt has had vaginal bleeding. She was dx with MAB yesterday. Pt ate at 10 pm . Pain has gotten better. She had not been drinking much today  OB History     Gravida  3   Para  2   Term  1   Preterm  1   AB      Living  1      SAB      TAB      Ectopic      Multiple  0   Live Births  1           Past Medical History:  Diagnosis Date   Anemia    Gestational diabetes    Gestational diabetes mellitus, antepartum    Vaginal discharge during pregnancy in third trimester 03/27/2015    Past Surgical History:  Procedure Laterality Date   CESAREAN SECTION     CESAREAN SECTION N/A 03/28/2015   Procedure: CESAREAN SECTION;  Surgeon: Maxie Better, MD;  Location: [facility];  Service: Obstetrics;  Laterality: N/A;   none      Family History  Problem Relation Age of Onset   Diabetes Other    Stroke Other    Obesity Other    Heart disease Other     Social History   Tobacco Use   Smoking status: Former Smoker    Quit date: 08/25/2014    Years since quitting: 5.5   Smokeless tobacco: Former Forensic psychologist Use: Never used  Substance Use Topics   Alcohol use: No   Drug use: No    Allergies: No Known Allergies  Medications Prior to Admission  Medication Sig Dispense Refill Last Dose   acetaminophen (TYLENOL) 500 MG tablet Take 1,000 mg by mouth every 6 (six) hours as needed for moderate pain.      Bisacodyl (DULCOLAX PO) Take 1 tablet by mouth daily as needed (constipation). (Patient not taking: Reported on 03/28/2020)      ibuprofen (ADVIL,MOTRIN) 800 MG tablet Take 1 tablet (800 mg  total) by mouth every 8 (eight) hours. (Patient not taking: Reported on 03/28/2020) 45 tablet 0    Iron TABS Take 1 each by mouth daily. (Patient not taking: Reported on 03/28/2020)      magnesium oxide (MAG-OX) 400 (241.3 MG) MG tablet Take 0.5-1 tablets (200-400 mg total) by mouth daily. Constipation prevention - take daily (Patient not taking: Reported on 03/28/2020) 30 tablet 4    mycophenolate (MYFORTIC) 180 MG EC tablet Take 500 mg by mouth in the morning, at noon, and at bedtime.      oxyCODONE-acetaminophen (PERCOCET/ROXICET) 5-325 MG tablet Take 1-2 tablets by mouth every 4 (four) hours as needed (for pain scale greater than 7). (Patient not taking: Reported on 03/28/2020) 30 tablet 0    Prenatal Multivit-Min-Fe-FA (PRENATAL VITAMINS PO) Take 1 each by mouth daily.       Review of Systems Physical Exam   Blood pressure 103/65, pulse 81, temperature 98.4 F (36.9 C), temperature source Oral, resp. rate 18, height 5\' 2"  (  1.575 m), weight 87.1 kg, last menstrual period 02/14/2020, SpO2 100 %, unknown if currently breastfeeding.  Physical Exam Constitutional:      Appearance: Normal appearance.  Eyes:     Extraocular Movements: Extraocular movements intact.  Cardiovascular:     Rate and Rhythm: Regular rhythm.  Pulmonary:     Effort: Pulmonary effort is normal.  Abdominal:     General: Abdomen is flat.  Genitourinary:    General: Normal vulva.     Comments: Scant blood Skin:    General: Skin is warm and dry.  Neurological:     Mental Status: She is alert and oriented to person, place, and time.  Psychiatric:        Mood and Affect: Mood normal.        Behavior: Behavior normal.     MAU Course  Procedures  MDM   Assessment and Plan  MAB Vaginal bleeding Syncopal episode cbc, T&S, Covid-19. Will sched surgery later today due to pt having eaten. Reviewed planned D&E . Risk of surgery disc as well . Agree with plan. /c home.  Shelton Soler A Jocie Meroney 03/29/2020, 2:25 AM

## 2020-03-29 NOTE — Discharge Instructions (Signed)
Clear liquid diet Soaking a regular pad every hour or more frequently

## 2020-03-30 ENCOUNTER — Encounter (HOSPITAL_COMMUNITY): Payer: Self-pay | Admitting: Obstetrics and Gynecology

## 2020-03-31 LAB — SURGICAL PATHOLOGY

## 2020-03-31 NOTE — Op Note (Signed)
NAMEREILEY, BERTAGNOLLI MEDICAL RECORD [id number] ACCOUNT [id number] DATE OF BIRTH:Sep 17, 1985 FACILITY: [facility] LOCATION: [facility] PHYSICIAN:Lakisa Lotz A. Dorrie Cocuzza, MD  OPERATIVE REPORT  DATE OF PROCEDURE:  03/29/2020  PREOPERATIVE DIAGNOSES:   1.  Missed abortion.   2.  Vaginal bleeding.  PROCEDURE:  Ultrasound-guided suction dilation and evacuation.  POSTOPERATIVE DIAGNOSES:   1.  Missed abortion.  2.  Vaginal bleeding.  ANESTHESIA:  General.  SURGEON:  Maxie Better, MD  ASSISTANT:  None.  DESCRIPTION OF PROCEDURE:  Under adequate general anesthesia, the patient was placed in the dorsal lithotomy position.  The bladder was not catheterized in anticipation of further planned ultrasound.  The patient was sterilely prepped and draped in usual  fashion.  Bivalve speculum was placed in the vagina.  Single-tooth tenaculum was placed on the anterior lip of the cervix.  The cervix was already about a centimeter dilated. The cervix easily accepted a #20 Pratt dilator.  Under ultrasound guidance, a #9 mm curved suction cannula was introduced into the uterine cavity, as well as with a ring forceps with  removal of a large sac with placental tissue.  The rest of the cavity was then subsequently suctioned and curetted until all tissue was felt to be removed at which time, all instruments then removed from the vagina.  Specimen labeled products of conception were sent to pathology.  ESTIMATED BLOOD LOSS:  10 mL.  COMPLICATIONS:  None.  DISPOSITION:  The patient tolerated the procedure well and was transferred to recovery room in stable condition.  VN/NUANCE  D:03/29/2020 T:03/29/2020 JOB:[id number]

## 2020-04-04 ENCOUNTER — Ambulatory Visit: Payer: Self-pay
